# Patient Record
Sex: Female | Born: 1950
Health system: Southern US, Community
[De-identification: ages and names within clinical notes are randomized; demographics above are authoritative.]

## PROBLEM LIST (undated history)

## (undated) DIAGNOSIS — E7800 Pure hypercholesterolemia, unspecified: Secondary | ICD-10-CM

## (undated) DIAGNOSIS — I10 Essential (primary) hypertension: Secondary | ICD-10-CM

## (undated) DIAGNOSIS — I341 Nonrheumatic mitral (valve) prolapse: Secondary | ICD-10-CM

## (undated) DIAGNOSIS — K5792 Diverticulitis of intestine, part unspecified, without perforation or abscess without bleeding: Secondary | ICD-10-CM

## (undated) DIAGNOSIS — N951 Menopausal and female climacteric states: Secondary | ICD-10-CM

## (undated) DIAGNOSIS — E119 Type 2 diabetes mellitus without complications: Secondary | ICD-10-CM

## (undated) DIAGNOSIS — C50919 Malignant neoplasm of unspecified site of unspecified female breast: Secondary | ICD-10-CM

## (undated) DIAGNOSIS — D219 Benign neoplasm of connective and other soft tissue, unspecified: Secondary | ICD-10-CM

## (undated) HISTORY — DX: Menopausal and female climacteric states: N95.1

## (undated) HISTORY — DX: Pure hypercholesterolemia, unspecified: E78.00

## (undated) HISTORY — DX: Benign neoplasm of connective and other soft tissue, unspecified: D21.9

## (undated) HISTORY — DX: Malignant neoplasm of unspecified site of unspecified female breast: C50.919

## (undated) HISTORY — DX: Nonrheumatic mitral (valve) prolapse: I34.1

## (undated) HISTORY — DX: Diverticulitis of intestine, part unspecified, without perforation or abscess without bleeding: K57.92

## (undated) HISTORY — PX: FINGER AMPUTATION: SHX636

## (undated) HISTORY — PX: OTHER SURGICAL HISTORY: SHX169

---

## 1979-06-22 DIAGNOSIS — I341 Nonrheumatic mitral (valve) prolapse: Secondary | ICD-10-CM

## 1979-06-22 HISTORY — DX: Nonrheumatic mitral (valve) prolapse: I34.1

## 1999-05-16 ENCOUNTER — Other Ambulatory Visit: Admission: RE | Admit: 1999-05-16 | Discharge: 1999-05-16 | Payer: Self-pay | Admitting: *Deleted

## 2000-05-13 ENCOUNTER — Encounter: Admission: RE | Admit: 2000-05-13 | Discharge: 2000-05-13 | Payer: Self-pay | Admitting: *Deleted

## 2000-05-13 ENCOUNTER — Encounter: Payer: Self-pay | Admitting: *Deleted

## 2000-10-01 ENCOUNTER — Other Ambulatory Visit: Admission: RE | Admit: 2000-10-01 | Discharge: 2000-10-01 | Payer: Self-pay | Admitting: *Deleted

## 2001-06-10 ENCOUNTER — Encounter: Admission: RE | Admit: 2001-06-10 | Discharge: 2001-06-10 | Payer: Self-pay | Admitting: *Deleted

## 2001-06-10 ENCOUNTER — Encounter: Payer: Self-pay | Admitting: *Deleted

## 2001-12-02 ENCOUNTER — Other Ambulatory Visit: Admission: RE | Admit: 2001-12-02 | Discharge: 2001-12-02 | Payer: Self-pay | Admitting: *Deleted

## 2001-12-15 ENCOUNTER — Encounter: Admission: RE | Admit: 2001-12-15 | Discharge: 2001-12-15 | Payer: Self-pay | Admitting: *Deleted

## 2001-12-15 ENCOUNTER — Encounter: Payer: Self-pay | Admitting: *Deleted

## 2002-12-31 ENCOUNTER — Other Ambulatory Visit: Admission: RE | Admit: 2002-12-31 | Discharge: 2002-12-31 | Payer: Self-pay | Admitting: *Deleted

## 2003-10-13 ENCOUNTER — Encounter: Admission: RE | Admit: 2003-10-13 | Discharge: 2003-10-13 | Payer: Self-pay | Admitting: *Deleted

## 2003-10-22 HISTORY — PX: BREAST SURGERY: SHX581

## 2003-11-16 ENCOUNTER — Encounter: Admission: RE | Admit: 2003-11-16 | Discharge: 2003-11-16 | Payer: Self-pay | Admitting: *Deleted

## 2003-11-16 ENCOUNTER — Encounter (INDEPENDENT_AMBULATORY_CARE_PROVIDER_SITE_OTHER): Payer: Self-pay | Admitting: *Deleted

## 2003-11-22 ENCOUNTER — Encounter: Admission: RE | Admit: 2003-11-22 | Discharge: 2003-11-22 | Payer: Self-pay | Admitting: Surgery

## 2003-12-07 ENCOUNTER — Encounter (INDEPENDENT_AMBULATORY_CARE_PROVIDER_SITE_OTHER): Payer: Self-pay | Admitting: Specialist

## 2003-12-08 ENCOUNTER — Inpatient Hospital Stay (HOSPITAL_COMMUNITY): Admission: RE | Admit: 2003-12-08 | Discharge: 2003-12-09 | Payer: Self-pay | Admitting: Surgery

## 2004-01-03 ENCOUNTER — Ambulatory Visit: Admission: RE | Admit: 2004-01-03 | Discharge: 2004-01-03 | Payer: Self-pay | Admitting: Oncology

## 2004-01-03 ENCOUNTER — Encounter (INDEPENDENT_AMBULATORY_CARE_PROVIDER_SITE_OTHER): Payer: Self-pay | Admitting: Cardiology

## 2004-01-05 ENCOUNTER — Ambulatory Visit (HOSPITAL_COMMUNITY): Admission: RE | Admit: 2004-01-05 | Discharge: 2004-01-05 | Payer: Self-pay | Admitting: Oncology

## 2004-04-17 ENCOUNTER — Encounter (INDEPENDENT_AMBULATORY_CARE_PROVIDER_SITE_OTHER): Payer: Self-pay | Admitting: Cardiology

## 2004-04-17 ENCOUNTER — Ambulatory Visit: Admission: RE | Admit: 2004-04-17 | Discharge: 2004-04-17 | Payer: Self-pay | Admitting: Oncology

## 2004-07-05 ENCOUNTER — Encounter (INDEPENDENT_AMBULATORY_CARE_PROVIDER_SITE_OTHER): Payer: Self-pay | Admitting: *Deleted

## 2004-07-05 ENCOUNTER — Ambulatory Visit (HOSPITAL_COMMUNITY): Admission: RE | Admit: 2004-07-05 | Discharge: 2004-07-05 | Payer: Self-pay | Admitting: Surgery

## 2004-08-29 ENCOUNTER — Ambulatory Visit: Payer: Self-pay | Admitting: Oncology

## 2004-09-21 ENCOUNTER — Ambulatory Visit: Payer: Self-pay

## 2004-10-21 DIAGNOSIS — C50919 Malignant neoplasm of unspecified site of unspecified female breast: Secondary | ICD-10-CM

## 2004-10-21 HISTORY — DX: Malignant neoplasm of unspecified site of unspecified female breast: C50.919

## 2004-10-31 ENCOUNTER — Ambulatory Visit: Payer: Self-pay | Admitting: Oncology

## 2005-01-01 ENCOUNTER — Ambulatory Visit: Payer: Self-pay | Admitting: Oncology

## 2005-01-01 ENCOUNTER — Ambulatory Visit: Payer: Self-pay

## 2005-01-02 ENCOUNTER — Ambulatory Visit (HOSPITAL_COMMUNITY): Admission: RE | Admit: 2005-01-02 | Discharge: 2005-01-02 | Payer: Self-pay | Admitting: Oncology

## 2005-03-05 ENCOUNTER — Ambulatory Visit: Payer: Self-pay | Admitting: Oncology

## 2005-05-07 ENCOUNTER — Ambulatory Visit: Payer: Self-pay | Admitting: Oncology

## 2005-07-04 ENCOUNTER — Encounter (INDEPENDENT_AMBULATORY_CARE_PROVIDER_SITE_OTHER): Payer: Self-pay | Admitting: *Deleted

## 2005-07-04 ENCOUNTER — Ambulatory Visit: Admission: RE | Admit: 2005-07-04 | Discharge: 2005-07-04 | Payer: Self-pay | Admitting: Oncology

## 2005-07-09 ENCOUNTER — Ambulatory Visit: Payer: Self-pay | Admitting: Oncology

## 2005-07-19 ENCOUNTER — Other Ambulatory Visit: Admission: RE | Admit: 2005-07-19 | Discharge: 2005-07-19 | Payer: Self-pay | Admitting: *Deleted

## 2005-10-08 ENCOUNTER — Ambulatory Visit: Payer: Self-pay | Admitting: Oncology

## 2005-10-16 IMAGING — CR DG CHEST 2V
2 series · 2 of 2 positions shown · non-contrast
Comparison: none

CLINICAL DATA: Breast carcinoma.
 CHEST, TWO VIEW
 There is no previous available for comparison.
 The heart size and mediastinal contours are unremarkable.  The lungs are clear.  The visualized skeleton is unremarkable.
 IMPRESSION
 No active disease.

[view not recorded (1 of 2)]
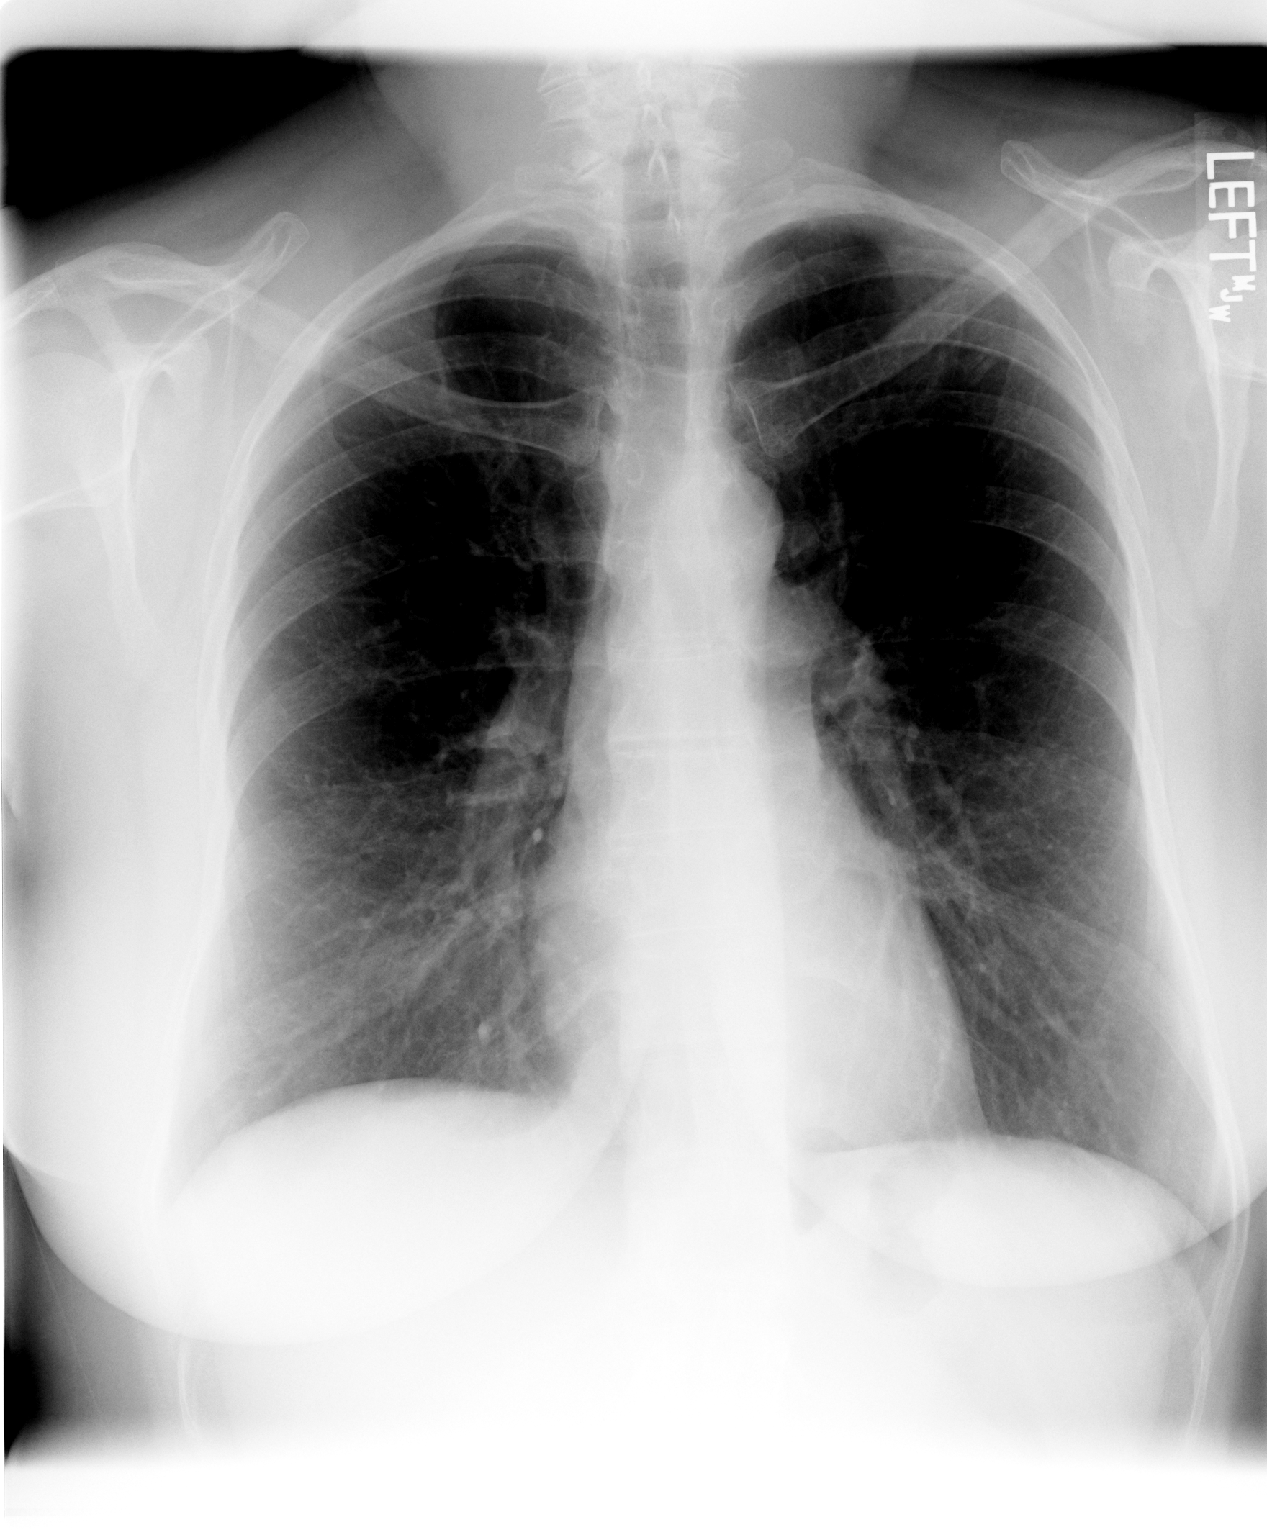

[view not recorded (2 of 2)]
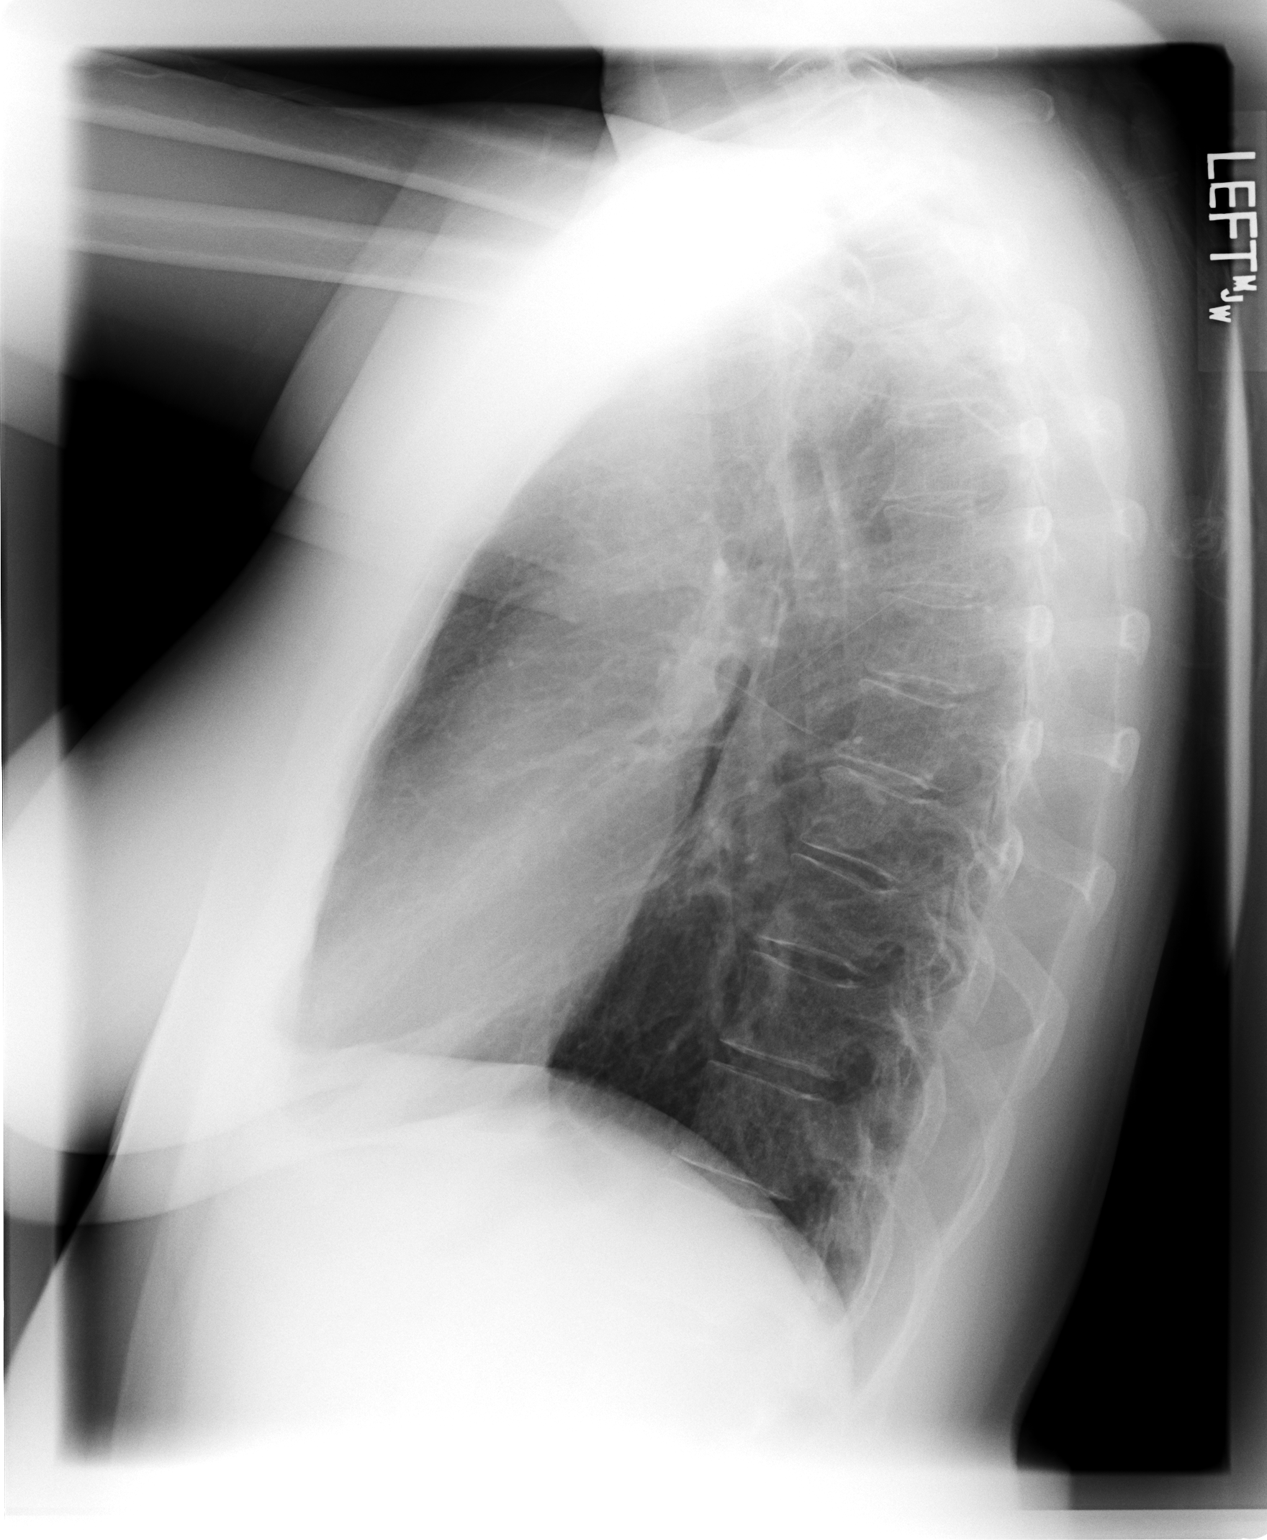

[2 of 2 positions shown; findings below may reference images not displayed]

## 2006-01-23 ENCOUNTER — Ambulatory Visit: Payer: Self-pay | Admitting: Oncology

## 2006-01-23 LAB — COMPREHENSIVE METABOLIC PANEL
ALT: 20 U/L (ref 0–40)
AST: 24 U/L (ref 0–37)
Albumin: 4.6 g/dL (ref 3.5–5.2)
CO2: 28 mEq/L (ref 19–32)
Calcium: 9.9 mg/dL (ref 8.4–10.5)
Chloride: 103 mEq/L (ref 96–112)
Creatinine, Ser: 0.7 mg/dL (ref 0.4–1.2)
Potassium: 3.8 mEq/L (ref 3.5–5.3)
Total Protein: 6.9 g/dL (ref 6.0–8.3)

## 2006-01-23 LAB — CBC WITH DIFFERENTIAL/PLATELET
BASO%: 0.4 % (ref 0.0–2.0)
HCT: 38 % (ref 34.8–46.6)
HGB: 13.1 g/dL (ref 11.6–15.9)
MCHC: 34.4 g/dL (ref 32.0–36.0)
MONO#: 0.6 10*3/uL (ref 0.1–0.9)
NEUT%: 58.4 % (ref 39.6–76.8)
RDW: 13.1 % (ref 11.3–14.5)
WBC: 7.2 10*3/uL (ref 3.9–10.0)
lymph#: 2.3 10*3/uL (ref 0.9–3.3)

## 2006-01-23 LAB — CANCER ANTIGEN 27.29: CA 27.29: 24 U/mL (ref 0–39)

## 2006-07-30 ENCOUNTER — Ambulatory Visit: Payer: Self-pay | Admitting: Oncology

## 2006-08-11 ENCOUNTER — Encounter: Admission: RE | Admit: 2006-08-11 | Discharge: 2006-08-11 | Payer: Self-pay | Admitting: Oncology

## 2006-10-21 HISTORY — PX: COLONOSCOPY: SHX174

## 2006-12-25 ENCOUNTER — Other Ambulatory Visit: Admission: RE | Admit: 2006-12-25 | Discharge: 2006-12-25 | Payer: Self-pay | Admitting: Obstetrics and Gynecology

## 2007-01-22 ENCOUNTER — Ambulatory Visit: Payer: Self-pay | Admitting: Oncology

## 2007-01-27 ENCOUNTER — Ambulatory Visit (HOSPITAL_COMMUNITY): Admission: RE | Admit: 2007-01-27 | Discharge: 2007-01-27 | Payer: Self-pay | Admitting: Oncology

## 2007-01-27 LAB — CBC WITH DIFFERENTIAL/PLATELET
BASO%: 0.4 % (ref 0.0–2.0)
EOS%: 1.1 % (ref 0.0–7.0)
HGB: 13.1 g/dL (ref 11.6–15.9)
MCH: 31.7 pg (ref 26.0–34.0)
MCHC: 35.8 g/dL (ref 32.0–36.0)
MCV: 88.6 fL (ref 81.0–101.0)
MONO%: 7.9 % (ref 0.0–13.0)
RBC: 4.15 10*6/uL (ref 3.70–5.32)
RDW: 13.1 % (ref 11.3–14.5)
lymph#: 2.8 10*3/uL (ref 0.9–3.3)

## 2007-01-27 LAB — COMPREHENSIVE METABOLIC PANEL
ALT: 23 U/L (ref 0–35)
AST: 24 U/L (ref 0–37)
CO2: 26 mEq/L (ref 19–32)
Calcium: 9.6 mg/dL (ref 8.4–10.5)
Chloride: 103 mEq/L (ref 96–112)
Potassium: 3.8 mEq/L (ref 3.5–5.3)
Sodium: 141 mEq/L (ref 135–145)
Total Protein: 7 g/dL (ref 6.0–8.3)

## 2007-01-27 LAB — CANCER ANTIGEN 27.29: CA 27.29: 21 U/mL (ref 0–39)

## 2007-02-27 LAB — HM COLONOSCOPY: HM Colonoscopy: NORMAL

## 2007-04-28 ENCOUNTER — Ambulatory Visit: Payer: Self-pay | Admitting: Family Medicine

## 2007-07-10 ENCOUNTER — Ambulatory Visit: Payer: Self-pay | Admitting: Oncology

## 2007-07-14 LAB — CBC WITH DIFFERENTIAL/PLATELET
BASO%: 0.2 % (ref 0.0–2.0)
Basophils Absolute: 0 10*3/uL (ref 0.0–0.1)
EOS%: 1.5 % (ref 0.0–7.0)
MCH: 31.5 pg (ref 26.0–34.0)
MCHC: 35.5 g/dL (ref 32.0–36.0)
MCV: 88.6 fL (ref 81.0–101.0)
MONO%: 6.2 % (ref 0.0–13.0)
NEUT%: 54.7 % (ref 39.6–76.8)
RDW: 12.8 % (ref 11.3–14.5)
lymph#: 2.6 10*3/uL (ref 0.9–3.3)

## 2007-07-14 LAB — COMPREHENSIVE METABOLIC PANEL
ALT: 19 U/L (ref 0–35)
AST: 20 U/L (ref 0–37)
Alkaline Phosphatase: 56 U/L (ref 39–117)
BUN: 24 mg/dL — ABNORMAL HIGH (ref 6–23)
Calcium: 9.3 mg/dL (ref 8.4–10.5)
Chloride: 103 mEq/L (ref 96–112)
Creatinine, Ser: 0.76 mg/dL (ref 0.40–1.20)
Potassium: 3.9 mEq/L (ref 3.5–5.3)

## 2007-10-29 ENCOUNTER — Ambulatory Visit: Payer: Self-pay | Admitting: Oncology

## 2007-10-29 LAB — COMPREHENSIVE METABOLIC PANEL
AST: 20 U/L (ref 0–37)
Albumin: 4.8 g/dL (ref 3.5–5.2)
Alkaline Phosphatase: 55 U/L (ref 39–117)
BUN: 16 mg/dL (ref 6–23)
Calcium: 9.6 mg/dL (ref 8.4–10.5)
Chloride: 105 mEq/L (ref 96–112)
Potassium: 4 mEq/L (ref 3.5–5.3)
Sodium: 143 mEq/L (ref 135–145)
Total Protein: 7.5 g/dL (ref 6.0–8.3)

## 2007-10-29 LAB — CBC WITH DIFFERENTIAL/PLATELET
BASO%: 1.2 % (ref 0.0–2.0)
Basophils Absolute: 0.1 10*3/uL (ref 0.0–0.1)
EOS%: 1.7 % (ref 0.0–7.0)
HCT: 38.7 % (ref 34.8–46.6)
HGB: 13.4 g/dL (ref 11.6–15.9)
LYMPH%: 38.8 % (ref 14.0–48.0)
MCH: 30.6 pg (ref 26.0–34.0)
MCHC: 34.6 g/dL (ref 32.0–36.0)
MCV: 88.5 fL (ref 81.0–101.0)
MONO%: 8.5 % (ref 0.0–13.0)
NEUT%: 49.8 % (ref 39.6–76.8)

## 2007-12-25 ENCOUNTER — Ambulatory Visit: Payer: Self-pay | Admitting: Family Medicine

## 2008-01-27 ENCOUNTER — Ambulatory Visit: Payer: Self-pay | Admitting: Oncology

## 2008-02-01 LAB — CBC WITH DIFFERENTIAL/PLATELET
Basophils Absolute: 0 10*3/uL (ref 0.0–0.1)
EOS%: 1.4 % (ref 0.0–7.0)
HGB: 13.1 g/dL (ref 11.6–15.9)
MCH: 31.3 pg (ref 26.0–34.0)
MCV: 87.7 fL (ref 81.0–101.0)
MONO%: 8.2 % (ref 0.0–13.0)
RDW: 12.9 % (ref 11.3–14.5)

## 2008-02-02 LAB — COMPREHENSIVE METABOLIC PANEL
AST: 20 U/L (ref 0–37)
Albumin: 4.5 g/dL (ref 3.5–5.2)
Alkaline Phosphatase: 55 U/L (ref 39–117)
BUN: 20 mg/dL (ref 6–23)
Creatinine, Ser: 0.77 mg/dL (ref 0.40–1.20)
Potassium: 3.7 mEq/L (ref 3.5–5.3)
Total Bilirubin: 0.3 mg/dL (ref 0.3–1.2)

## 2008-02-02 LAB — CANCER ANTIGEN 27.29: CA 27.29: 20 U/mL (ref 0–39)

## 2008-02-10 LAB — ESTRADIOL, ULTRA SENS: Estradiol, Ultra Sensitive: <2 pg/mL

## 2008-02-19 ENCOUNTER — Other Ambulatory Visit: Admission: RE | Admit: 2008-02-19 | Discharge: 2008-02-19 | Payer: Self-pay | Admitting: Obstetrics and Gynecology

## 2008-03-02 ENCOUNTER — Ambulatory Visit (HOSPITAL_COMMUNITY): Admission: RE | Admit: 2008-03-02 | Discharge: 2008-03-02 | Payer: Self-pay | Admitting: Oncology

## 2008-05-31 ENCOUNTER — Ambulatory Visit: Payer: Self-pay | Admitting: Family Medicine

## 2008-08-09 ENCOUNTER — Ambulatory Visit: Payer: Self-pay | Admitting: Oncology

## 2008-08-11 ENCOUNTER — Encounter: Admission: RE | Admit: 2008-08-11 | Discharge: 2008-08-11 | Payer: Self-pay | Admitting: Oncology

## 2008-08-11 LAB — CBC WITH DIFFERENTIAL/PLATELET
BASO%: 0.4 % (ref 0.0–2.0)
LYMPH%: 30.7 % (ref 14.0–48.0)
MCHC: 34.5 g/dL (ref 32.0–36.0)
MONO#: 0.4 10*3/uL (ref 0.1–0.9)
MONO%: 5.5 % (ref 0.0–13.0)
Platelets: 240 10*3/uL (ref 145–400)
RBC: 4.33 10*6/uL (ref 3.70–5.32)
WBC: 7.7 10*3/uL (ref 3.9–10.0)

## 2008-08-12 LAB — COMPREHENSIVE METABOLIC PANEL
ALT: 31 U/L (ref 0–35)
CO2: 24 mEq/L (ref 19–32)
Calcium: 8.9 mg/dL (ref 8.4–10.5)
Chloride: 103 mEq/L (ref 96–112)
Potassium: 3.7 mEq/L (ref 3.5–5.3)
Sodium: 138 mEq/L (ref 135–145)
Total Bilirubin: 0.4 mg/dL (ref 0.3–1.2)
Total Protein: 7.2 g/dL (ref 6.0–8.3)

## 2008-08-12 LAB — CANCER ANTIGEN 27.29: CA 27.29: 21 U/mL (ref 0–39)

## 2008-12-11 IMAGING — CR DG CHEST 2V
2 series · 2 of 2 positions shown · non-contrast
Comparison: 01/02/05.

CLINICAL DATA: 55 year-old-female with bilateral mastectomies.  History of breast cancer. 
 CHEST - 2 VIEW:

[view not recorded (1 of 2)]
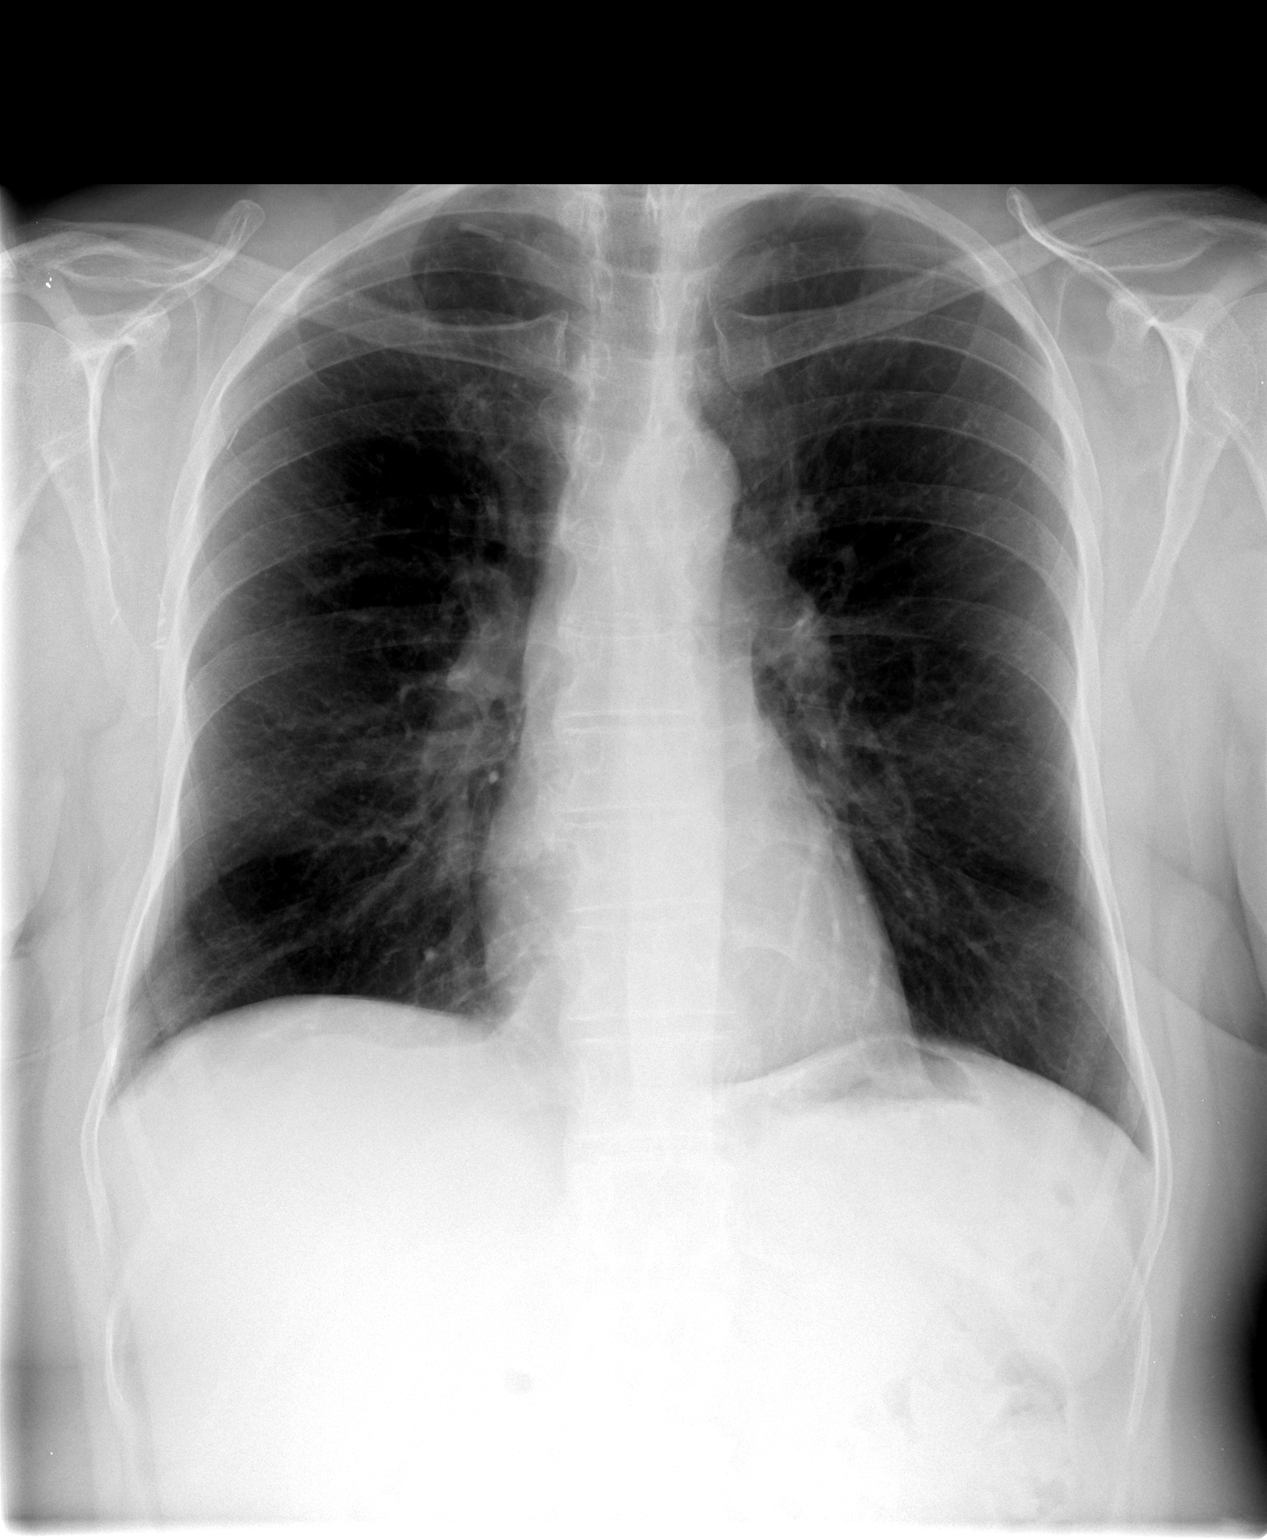

[view not recorded (2 of 2)]
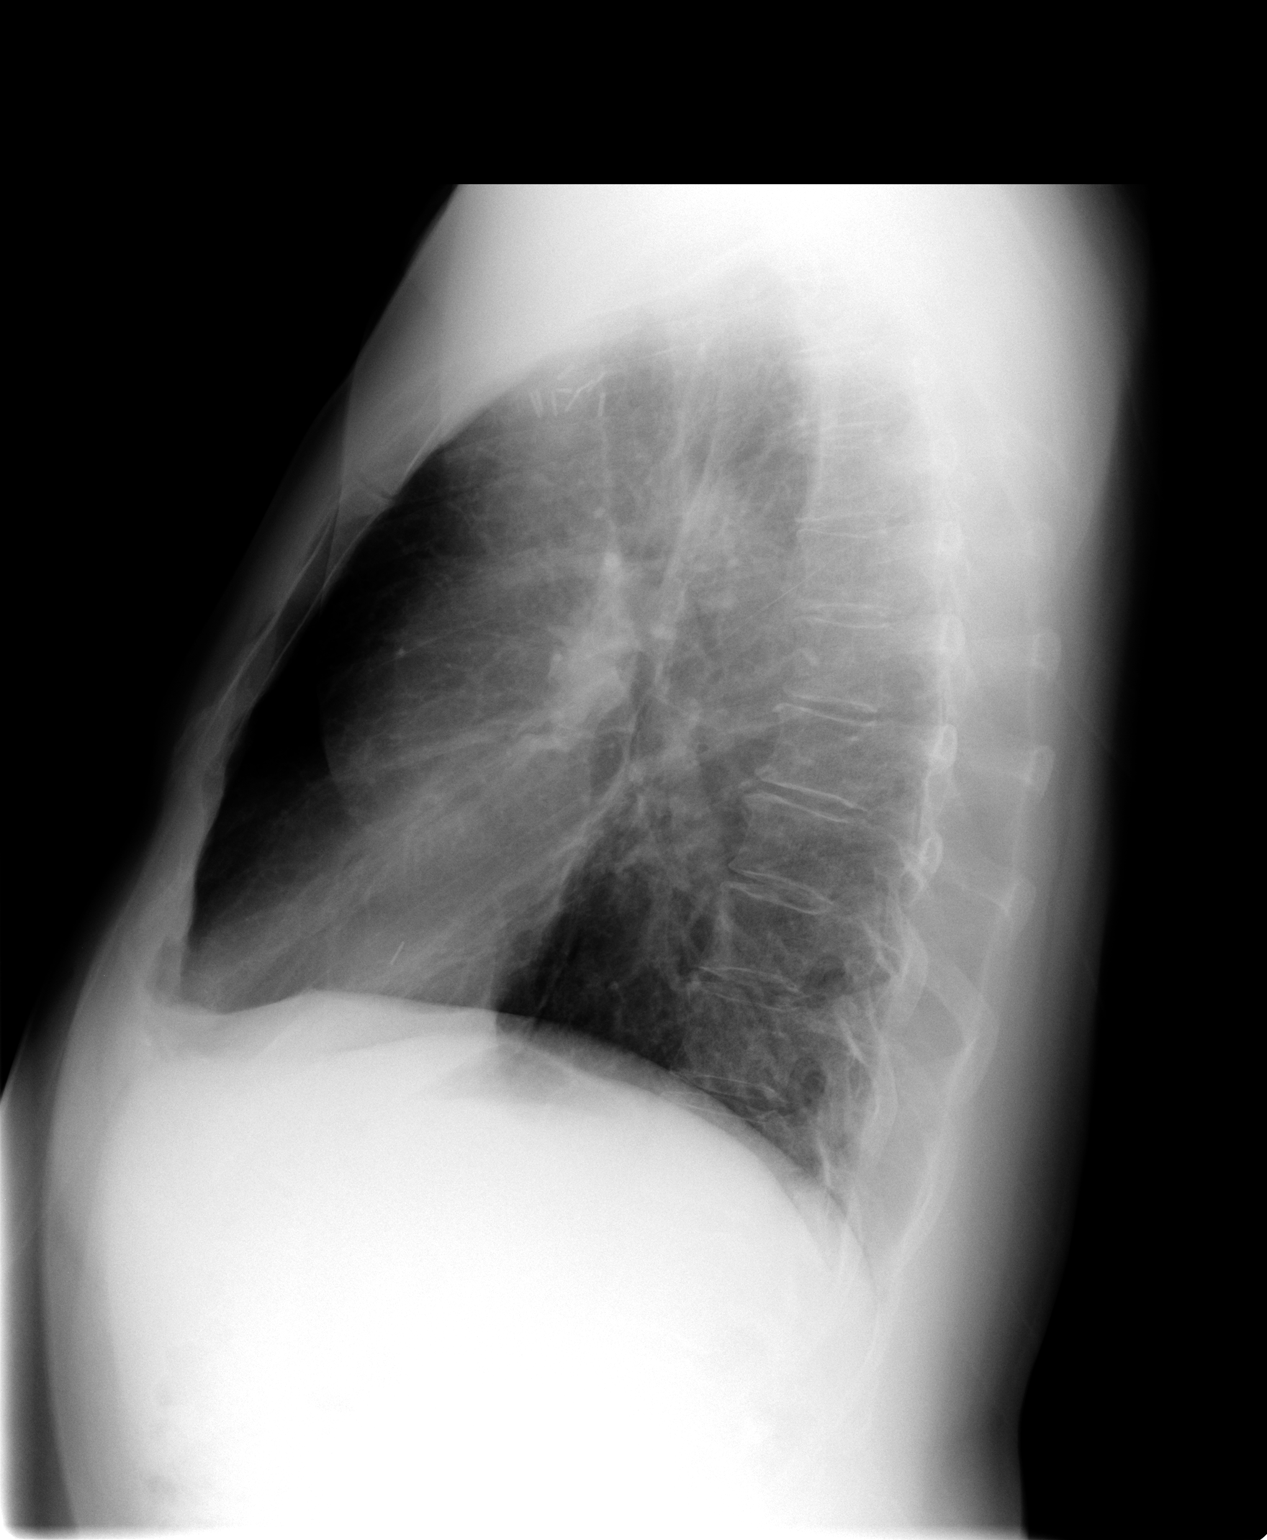

[2 of 2 positions shown; findings below may reference images not displayed]

FINDINGS: The cardiac silhouette, mediastinal and hilar contours are within normal limits and stable.  The lungs are clear.  Stable surgical changes in the right axilla.
IMPRESSION: No acute cardiopulmonary findings.   Stable appearance of chest since 01/02/05.

## 2009-01-16 ENCOUNTER — Ambulatory Visit: Payer: Self-pay | Admitting: Family Medicine

## 2009-02-07 ENCOUNTER — Ambulatory Visit: Payer: Self-pay | Admitting: Oncology

## 2009-02-09 ENCOUNTER — Ambulatory Visit (HOSPITAL_COMMUNITY): Admission: RE | Admit: 2009-02-09 | Discharge: 2009-02-09 | Payer: Self-pay | Admitting: Oncology

## 2009-02-09 LAB — COMPREHENSIVE METABOLIC PANEL
Albumin: 4.3 g/dL (ref 3.5–5.2)
Alkaline Phosphatase: 56 U/L (ref 39–117)
CO2: 27 mEq/L (ref 19–32)
Chloride: 101 mEq/L (ref 96–112)
Glucose, Bld: 97 mg/dL (ref 70–99)
Potassium: 4 mEq/L (ref 3.5–5.3)
Sodium: 139 mEq/L (ref 135–145)
Total Protein: 6.9 g/dL (ref 6.0–8.3)

## 2009-02-09 LAB — CBC WITH DIFFERENTIAL/PLATELET
Eosinophils Absolute: 0.2 10*3/uL (ref 0.0–0.5)
MONO#: 0.5 10*3/uL (ref 0.1–0.9)
MONO%: 6.6 % (ref 0.0–14.0)
NEUT#: 4.5 10*3/uL (ref 1.5–6.5)
RBC: 4.27 10*6/uL (ref 3.70–5.45)
RDW: 13.1 % (ref 11.2–14.5)
WBC: 7.8 10*3/uL (ref 3.9–10.3)
lymph#: 2.6 10*3/uL (ref 0.9–3.3)

## 2009-06-30 ENCOUNTER — Ambulatory Visit: Payer: Self-pay | Admitting: Family Medicine

## 2009-07-21 ENCOUNTER — Ambulatory Visit: Payer: Self-pay | Admitting: Oncology

## 2009-07-25 LAB — CBC WITH DIFFERENTIAL/PLATELET
Basophils Absolute: 0 10*3/uL (ref 0.0–0.1)
Eosinophils Absolute: 0.1 10*3/uL (ref 0.0–0.5)
HGB: 13.3 g/dL (ref 11.6–15.9)
MONO#: 0.5 10*3/uL (ref 0.1–0.9)
NEUT#: 3.7 10*3/uL (ref 1.5–6.5)
Platelets: 249 10*3/uL (ref 145–400)
RBC: 4.23 10*6/uL (ref 3.70–5.45)
RDW: 13.4 % (ref 11.2–14.5)
WBC: 7.1 10*3/uL (ref 3.9–10.3)

## 2009-07-25 LAB — COMPREHENSIVE METABOLIC PANEL
Albumin: 4.6 g/dL (ref 3.5–5.2)
BUN: 16 mg/dL (ref 6–23)
CO2: 25 mEq/L (ref 19–32)
Calcium: 9.1 mg/dL (ref 8.4–10.5)
Glucose, Bld: 99 mg/dL (ref 70–99)
Potassium: 4 mEq/L (ref 3.5–5.3)
Sodium: 142 mEq/L (ref 135–145)
Total Protein: 7.2 g/dL (ref 6.0–8.3)

## 2009-08-18 ENCOUNTER — Ambulatory Visit (HOSPITAL_COMMUNITY): Admission: RE | Admit: 2009-08-18 | Discharge: 2009-08-18 | Payer: Self-pay | Admitting: Oncology

## 2009-08-25 ENCOUNTER — Ambulatory Visit (HOSPITAL_COMMUNITY): Admission: RE | Admit: 2009-08-25 | Discharge: 2009-08-25 | Payer: Self-pay | Admitting: Oncology

## 2009-12-11 ENCOUNTER — Ambulatory Visit: Payer: Self-pay | Admitting: Oncology

## 2009-12-13 LAB — CBC WITH DIFFERENTIAL/PLATELET
BASO%: 0.6 % (ref 0.0–2.0)
EOS%: 1.5 % (ref 0.0–7.0)
HCT: 38.7 % (ref 34.8–46.6)
LYMPH%: 43.3 % (ref 14.0–49.7)
MCH: 31.7 pg (ref 25.1–34.0)
MCHC: 34.9 g/dL (ref 31.5–36.0)
MONO#: 0.6 10*3/uL (ref 0.1–0.9)
MONO%: 10 % (ref 0.0–14.0)
NEUT%: 44.6 % (ref 38.4–76.8)
Platelets: 222 10*3/uL (ref 145–400)
RBC: 4.25 10*6/uL (ref 3.70–5.45)
WBC: 5.6 10*3/uL (ref 3.9–10.3)

## 2010-03-09 ENCOUNTER — Ambulatory Visit: Payer: Self-pay | Admitting: Family Medicine

## 2010-05-04 ENCOUNTER — Ambulatory Visit: Payer: Self-pay | Admitting: Family Medicine

## 2010-07-16 ENCOUNTER — Ambulatory Visit: Payer: Self-pay | Admitting: Family Medicine

## 2010-08-07 ENCOUNTER — Ambulatory Visit: Payer: Self-pay | Admitting: Oncology

## 2010-08-09 ENCOUNTER — Ambulatory Visit (HOSPITAL_COMMUNITY): Admission: RE | Admit: 2010-08-09 | Discharge: 2010-08-09 | Payer: Self-pay | Admitting: Oncology

## 2010-08-09 LAB — COMPREHENSIVE METABOLIC PANEL
ALT: 20 U/L (ref 0–35)
Albumin: 4.2 g/dL (ref 3.5–5.2)
Alkaline Phosphatase: 31 U/L — ABNORMAL LOW (ref 39–117)
CO2: 26 mEq/L (ref 19–32)
Glucose, Bld: 113 mg/dL — ABNORMAL HIGH (ref 70–99)
Potassium: 3.8 mEq/L (ref 3.5–5.3)
Sodium: 143 mEq/L (ref 135–145)
Total Bilirubin: 0.3 mg/dL (ref 0.3–1.2)
Total Protein: 6.4 g/dL (ref 6.0–8.3)

## 2010-08-09 LAB — CBC WITH DIFFERENTIAL/PLATELET
BASO%: 0.5 % (ref 0.0–2.0)
Eosinophils Absolute: 0.1 10*3/uL (ref 0.0–0.5)
LYMPH%: 42.5 % (ref 14.0–49.7)
MCHC: 34 g/dL (ref 31.5–36.0)
MONO#: 0.6 10*3/uL (ref 0.1–0.9)
MONO%: 9.3 % (ref 0.0–14.0)
NEUT#: 2.8 10*3/uL (ref 1.5–6.5)
RBC: 3.9 10*6/uL (ref 3.70–5.45)
RDW: 13.3 % (ref 11.2–14.5)
WBC: 6 10*3/uL (ref 3.9–10.3)

## 2010-08-13 ENCOUNTER — Encounter: Admission: RE | Admit: 2010-08-13 | Discharge: 2010-08-13 | Payer: Self-pay | Admitting: Oncology

## 2010-08-13 LAB — HM DEXA SCAN

## 2010-11-10 ENCOUNTER — Encounter: Payer: Self-pay | Admitting: Surgery

## 2011-03-08 NOTE — Op Note (Signed)
[name], [name]                       ACCOUNT NO.:  [id number]   MEDICAL RECORD NO.:  [id number]                   PATIENT TYPE:  AMB   LOCATION:  DAY                                  FACILITY:  [facility]   PHYSICIAN:  Currie Paris, M.D.           DATE OF BIRTH:  [birth year]   DATE OF PROCEDURE:  07/05/2004  DATE OF DISCHARGE:                                 OPERATIVE REPORT   PREOPERATIVE DIAGNOSES:  Bilateral mastectomy, chronic seroma cavities.   POSTOPERATIVE DIAGNOSES:  Bilateral mastectomy, chronic seroma cavities.   OPERATION:  Drainage of bilateral seroma cavities with scarification of  capsule, partial excision of capsule and placement of Tisseel.   SURGEON:  Currie Paris, M.D.   ANESTHESIA:  General.   CLINICAL COURSE:  This patient has been undergoing chemotherapy and we have  never been able to dry up her bilateral seroma cavities. The right has been  much larger than the left. Now that she has basically finished her  chemotherapy, she was felt able to undergo surgical drainage.   DESCRIPTION OF PROCEDURE:  The patient was seen in the holding area and had  no further questions. She was taken to the operating room and after  satisfactory general anesthesia had been obtained, the breast areas were  prepped and draped. I opened the old mastectomy scar on the right side and  entered a large seroma cavity which extended pretty much the length of the  incision somewhat below it and then all the way up into the axilla.  Using  the cautery, I was able to excise portions of this cavity especially deep  along the muscle but there was very little way to excise the more  superficial or skin side of the capsule.  I tried to use the cautery on  coagulation current as much as possible to scarify or scare up the entire  capsule.  Once everything appeared to be dry, I put a 10 Blake drain in and  turned my attention to the other side.  This was done in the same  fashion  but had a smaller cavity but again extended somewhat towards the axilla.  Again we excised portions of it, scarified the rest.  I then used Tisseel  and put Tisseel along and into both cavities trying to apply it to the  capsule in all directions.  I then did that on the left side first, closed  it with staples and applied pressure and while that was being held did the  right side in a similar fashion and closed the wound with staples. The  drains were secured with 2-0 nylons. After approximately 10 minutes of  pressure, we applied sterile dressings. There had been no significant  bleeding during the case. All counts were correct.  Currie Paris, M.D.    CJS/MEDQ  D:  07/05/2004  T:  07/05/2004  Job:  [id number]

## 2011-03-08 NOTE — Op Note (Signed)
NAMESHEARON, [name]                       ACCOUNT NO.:  [id number]   MEDICAL RECORD NO.:  [id number]                   PATIENT TYPE:  OIB   LOCATION:  2899                                 FACILITY:  [facility]   PHYSICIAN:  Currie Paris, M.D.           DATE OF BIRTH:  Feb 09, 1951   DATE OF PROCEDURE:  12/07/2003  DATE OF DISCHARGE:                                 OPERATIVE REPORT   PREOPERATIVE DIAGNOSIS:  Carcinoma right breast, upper outer quadrant.   POSTOPERATIVE DIAGNOSIS:  Right breast carcinoma upper outer quadrant with  axillary metastases.   OPERATION:  1. Right total mastectomy with blue dye injection and sentinel node     dissection and axillary dissection.  2. Left prophylactic mastectomy.   SURGEON:  Currie Paris, M.D.   ASSISTANT:  Anselm Pancoast. Zachery Dakins, M.D.   ANESTHESIA:  General endotracheal anesthesia.   CLINICAL HISTORY:  This patient is a 60 year old lady who has recently  presented with a right breast mass which was found to be invasive carcinoma.  After a lengthy discussion with the patient, she expressed a strong desire  to have not only a right mastectomy but a prophylactic left mastectomy.  This was discussed at length and the plans were made for right mastectomy  with sentinel node biopsy and possible dissection as well as left  prophylactic mastectomy.  She desired no reconstruction.   DESCRIPTION OF PROCEDURE:  The patient was seen in the holding area and had  no further questions.  The right side had already been injected with sulfur  colloid and I marked it as the operative side as the patient had already  initialed that as the side of the lymph node.  We again discussed the need  for a prophylactic left mastectomy and she, again, asked that we proceed  with that.  The patient was taken to the operating room and after  satisfactory general anesthesia had been obtained, dilute methylene blue was  injected subareolarly on the right  side.  Both breasts were prepped and  draped as a single sterile field.   The right side was approached first and using the Neoprobe, I identified a  hot area in the axilla and marked the overlying skin and then outlined an  elliptical incision.  The incision was made along the superior aspect and a  superior flap raised going medially to the edge of the sternum, superiorly  towards the clavicle, and laterally to the axilla, and when I lifted up the  skin over the area marked I went a little bit deeper until I entered the  maxilla proper.  In doing so, I found a blue lymphatic and traced it to a  very bright blue node which had counts of about 3300.  Some were large,  about 1.5 cm, and I took this out.  Using the Neoprobe, I saw another hot  area just posterior to this and a little more  dissection showed another node  that was firm and was excised and had just developed some blue dye in it, as  well.  With this out, we sent this off, and there were no other hot areas,  no other blue areas, and no other readily palpable adenopathy.   Attention was returned to the breast and the inferior incision made and the  inferior flap raised going to the rectus sheath and then laterally to the  latissimus.  The breast was then removed starting medially and working  laterally and using cautery, taking it off along with the pectoralis fascia.  I opened the clavipectoral fascia and, at this point, Dr. Debby Bud from  pathology called to report that one of the two nodes was positive for  metastatic disease.  We then proceeded to full nodal dissection.  I opened  the clavipectoral fascia and began sweeping the axillary contents from  anterior to posterior and superior to inferior.  The second intercostal  nerve was identified and clipped.  The nerve and blood supply to the  pectoralis was preserved.  A couple of firm nodes were noted in the area of  the thoracodorsal vessels.  I did find the axillary vein and  stripped the  contents down from that taking care not to injure that.  The long thoracic  thoracodorsal nerves were both identified and preserved and the intervening  tissue teased out.  The vessels were clipped, tied, or cauterized as  indicated.  The final attachments to the latissimus were divided and the  specimen handed off.  I spent several minutes making sure everything was dry  and then put two 19 Blake drains in through the inferior flap, one towards  the axilla and one for the flap, and left a moist lap in place.   Attention was turned to the left side.  Gloves and instruments were changed.  Mastectomy was performed on this side identical to the right side with  raising of the skin flaps and basically no entry into the axilla, although  in the lateral aspect we found one low slightly enlarged low.  Bleeders,  again, were coagulated.  Once everything appeared to be dry along the skin  flaps, two 19 Blake drains were placed here.  A final irrigation here was  made, a final check for hemostasis, and the incision was closed with  staples.  Attention was turned back to the right side and this had remained  dry while we were working on the left and it was closed with staples, as  well.   The patient tolerated the procedure well.  There were no complications.  All  counts were correct.  Estimated blood loss was 150 mL.                                               Currie Paris, M.D.    CJS/MEDQ  D:  12/07/2003  T:  12/07/2003  Job:  [id number]

## 2011-04-03 ENCOUNTER — Other Ambulatory Visit: Payer: Self-pay | Admitting: Family Medicine

## 2011-05-02 ENCOUNTER — Other Ambulatory Visit: Payer: Self-pay | Admitting: Family Medicine

## 2011-05-30 ENCOUNTER — Other Ambulatory Visit: Payer: Self-pay | Admitting: Family Medicine

## 2011-05-31 ENCOUNTER — Other Ambulatory Visit: Payer: Self-pay | Admitting: *Deleted

## 2011-06-07 ENCOUNTER — Other Ambulatory Visit: Payer: Self-pay | Admitting: Family Medicine

## 2011-06-10 ENCOUNTER — Encounter: Payer: Self-pay | Admitting: Family Medicine

## 2011-06-11 ENCOUNTER — Encounter: Payer: Self-pay | Admitting: Family Medicine

## 2011-06-11 ENCOUNTER — Ambulatory Visit (INDEPENDENT_AMBULATORY_CARE_PROVIDER_SITE_OTHER): Payer: BC Managed Care – PPO | Admitting: Family Medicine

## 2011-06-11 VITALS — BP 114/70 | HR 86 | Ht 62.0 in | Wt 162.0 lb

## 2011-06-11 DIAGNOSIS — Z79899 Other long term (current) drug therapy: Secondary | ICD-10-CM

## 2011-06-11 DIAGNOSIS — E785 Hyperlipidemia, unspecified: Secondary | ICD-10-CM | POA: Insufficient documentation

## 2011-06-11 DIAGNOSIS — Z0000 Encounter for general adult medical examination without abnormal findings: Secondary | ICD-10-CM

## 2011-06-11 DIAGNOSIS — Z853 Personal history of malignant neoplasm of breast: Secondary | ICD-10-CM

## 2011-06-11 DIAGNOSIS — M818 Other osteoporosis without current pathological fracture: Secondary | ICD-10-CM | POA: Insufficient documentation

## 2011-06-11 LAB — COMPREHENSIVE METABOLIC PANEL
ALT: 15 U/L (ref 0–35)
Albumin: 4.3 g/dL (ref 3.5–5.2)
CO2: 24 mEq/L (ref 19–32)
Calcium: 9 mg/dL (ref 8.4–10.5)
Chloride: 106 mEq/L (ref 96–112)
Glucose, Bld: 101 mg/dL — ABNORMAL HIGH (ref 70–99)
Potassium: 4.4 mEq/L (ref 3.5–5.3)
Sodium: 140 mEq/L (ref 135–145)
Total Bilirubin: 0.4 mg/dL (ref 0.3–1.2)
Total Protein: 6.7 g/dL (ref 6.0–8.3)

## 2011-06-11 LAB — POCT URINALYSIS DIPSTICK
Bilirubin, UA: NEGATIVE
Blood, UA: NEGATIVE
Glucose, UA: NEGATIVE
Nitrite, UA: NEGATIVE
Spec Grav, UA: 1.02

## 2011-06-11 LAB — LIPID PANEL
Cholesterol: 162 mg/dL (ref 0–200)
Triglycerides: 277 mg/dL — ABNORMAL HIGH (ref <150)
VLDL: 55 mg/dL — ABNORMAL HIGH (ref 0–40)

## 2011-06-11 NOTE — Progress Notes (Signed)
Addended by: Ronnald Nian on: 06/11/2011 10:37 AM   Modules accepted: Orders

## 2011-06-11 NOTE — Patient Instructions (Signed)
Increased physical activity to a total of 150 minutes per week.

## 2011-06-11 NOTE — Progress Notes (Signed)
  Subjective:    Patient ID: [name], female    DOB: Sep 11, 1951, 60 y.o.   MRN: [id number]  HPI She is here for a complete examination. She was recently seen by her gynecologist. Apparently a vitamin D level was within the normal range. She continues on Fosamax for treatment of underlying osteoporosis secondary to cancer chemotherapy. She continues on Herceptin Crestor is having no difficulty with that. She continues on calcium, vitamin D. She does have some minor aches and pains. Her family and social history were reviewed and are in the record.   Review of Systems  Constitutional: Negative.   HENT: Negative.   Eyes: Negative.   Respiratory: Negative.   Cardiovascular: Negative.   Gastrointestinal: Negative.   Genitourinary: Negative.   Musculoskeletal: Negative.   Skin: Negative.   Neurological: Negative.   Hematological: Negative.   Psychiatric/Behavioral: Negative.        Objective:   Physical Exam BP 114/70  Pulse 86  Ht 5\' 2"  (1.575 m)  Wt 162 lb (73.483 kg)  BMI 29.63 kg/m2  General Appearance:    Alert, cooperative, no distress, appears stated age  Head:    Normocephalic, without obvious abnormality, atraumatic  Eyes:    PERRL, conjunctiva/corneas clear, EOM's intact, fundi    benign  Ears:    Normal TM's and external ear canals  Nose:   Nares normal, mucosa normal, no drainage or sinus   tenderness  Throat:   Lips, mucosa, and tongue normal; teeth and gums normal  Neck:   Supple, no lymphadenopathy;  thyroid:  no   enlargement/tenderness/nodules; no carotid   bruit or JVD  Back:    Spine nontender, no curvature, ROM normal, no CVA     tenderness  Lungs:     Clear to auscultation bilaterally without wheezes, rales or     ronchi; respirations unlabored  Chest Wall:    No tenderness or deformity   Heart:    Regular rate and rhythm, S1 and S2 normal, no murmur, rub   or gallop  Breast Exam:    Deferred to GYN  Abdomen:     Soft, non-tender, nondistended,  normoactive bowel sounds,    no masses, no hepatosplenomegaly  Genitalia:    Deferred to GYN     Extremities:   No clubbing, cyanosis or edema  Pulses:   2+ and symmetric all extremities  Skin:   Skin color, texture, turgor normal, no rashes or lesions  Lymph nodes:   Cervical, supraclavicular, and axillary nodes normal  Neurologic:   CNII-XII intact, normal strength, sensation and gait; reflexes 2+ and symmetric throughout          Psych:   Normal mood, affect, hygiene and grooming.           Assessment & Plan:  History of breast cancer. Osteoporosis. Hyperlipidemia. Obesity. Routine blood screening. Also encouraged her to become more physically active.

## 2011-06-12 ENCOUNTER — Telehealth: Payer: Self-pay

## 2011-06-12 LAB — CBC WITH DIFFERENTIAL/PLATELET
Eosinophils Absolute: 0.2 10*3/uL (ref 0.0–0.7)
Hemoglobin: 13.3 g/dL (ref 12.0–15.0)
Lymphocytes Relative: 46 % (ref 12–46)
Lymphs Abs: 2.5 10*3/uL (ref 0.7–4.0)
MCH: 29.9 pg (ref 26.0–34.0)
Monocytes Relative: 8 % (ref 3–12)
Neutrophils Relative %: 44 % (ref 43–77)
Platelets: 245 10*3/uL (ref 150–400)
RBC: 4.45 MIL/uL (ref 3.87–5.11)
WBC: 5.5 10*3/uL (ref 4.0–10.5)

## 2011-06-12 NOTE — Telephone Encounter (Signed)
Left message for pt labs look good

## 2011-07-04 ENCOUNTER — Other Ambulatory Visit: Payer: Self-pay | Admitting: Family Medicine

## 2011-08-06 ENCOUNTER — Encounter: Payer: BC Managed Care – PPO | Admitting: Oncology

## 2011-08-06 ENCOUNTER — Ambulatory Visit (HOSPITAL_COMMUNITY)
Admission: RE | Admit: 2011-08-06 | Discharge: 2011-08-06 | Disposition: A | Discharge status: Home / Self Care | Payer: BC Managed Care – PPO | Source: Ambulatory Visit | Attending: Oncology | Admitting: Oncology

## 2011-08-06 ENCOUNTER — Other Ambulatory Visit: Payer: Self-pay | Admitting: Oncology

## 2011-08-06 DIAGNOSIS — C50919 Malignant neoplasm of unspecified site of unspecified female breast: Secondary | ICD-10-CM | POA: Insufficient documentation

## 2011-08-06 LAB — CBC WITH DIFFERENTIAL/PLATELET
BASO%: 0.4 % (ref 0.0–2.0)
Basophils Absolute: 0 10*3/uL (ref 0.0–0.1)
EOS%: 2.7 % (ref 0.0–7.0)
HCT: 34.8 % (ref 34.8–46.6)
HGB: 12 g/dL (ref 11.6–15.9)
LYMPH%: 43.8 % (ref 14.0–49.7)
MCH: 31 pg (ref 25.1–34.0)
MCHC: 34.6 g/dL (ref 31.5–36.0)
MCV: 89.7 fL (ref 79.5–101.0)
MONO%: 9.4 % (ref 0.0–14.0)
NEUT%: 43.7 % (ref 38.4–76.8)
Platelets: 253 10*3/uL (ref 145–400)
lymph#: 3 10*3/uL (ref 0.9–3.3)

## 2011-08-06 LAB — COMPREHENSIVE METABOLIC PANEL
AST: 20 U/L (ref 0–37)
Albumin: 3.7 g/dL (ref 3.5–5.2)
BUN: 16 mg/dL (ref 6–23)
CO2: 27 mEq/L (ref 19–32)
Calcium: 9.3 mg/dL (ref 8.4–10.5)
Chloride: 103 mEq/L (ref 96–112)
Glucose, Bld: 111 mg/dL — ABNORMAL HIGH (ref 70–99)
Potassium: 3.9 mEq/L (ref 3.5–5.3)

## 2011-08-07 ENCOUNTER — Encounter: Payer: Self-pay | Admitting: Family Medicine

## 2011-08-08 ENCOUNTER — Other Ambulatory Visit: Payer: Self-pay | Admitting: Family Medicine

## 2011-08-09 NOTE — Telephone Encounter (Signed)
Medication refill

## 2011-08-13 ENCOUNTER — Encounter (HOSPITAL_BASED_OUTPATIENT_CLINIC_OR_DEPARTMENT_OTHER): Payer: BC Managed Care – PPO | Admitting: Oncology

## 2011-08-13 DIAGNOSIS — C50919 Malignant neoplasm of unspecified site of unspecified female breast: Secondary | ICD-10-CM

## 2011-08-22 ENCOUNTER — Other Ambulatory Visit: Payer: Self-pay | Admitting: Family Medicine

## 2011-08-22 NOTE — Telephone Encounter (Signed)
Med was refill

## 2011-09-05 ENCOUNTER — Other Ambulatory Visit: Payer: Self-pay | Admitting: Family Medicine

## 2011-09-14 ENCOUNTER — Other Ambulatory Visit: Payer: Self-pay | Admitting: Oncology

## 2011-11-16 ENCOUNTER — Other Ambulatory Visit: Payer: Self-pay | Admitting: Family Medicine

## 2011-12-09 ENCOUNTER — Other Ambulatory Visit: Payer: Self-pay | Admitting: Family Medicine

## 2012-01-30 ENCOUNTER — Other Ambulatory Visit: Payer: Self-pay | Admitting: Family Medicine

## 2012-01-31 ENCOUNTER — Telehealth: Payer: Self-pay | Admitting: Family Medicine

## 2012-01-31 MED ORDER — ALENDRONATE SODIUM 70 MG PO TABS: 70.0000 mg | ORAL_TABLET | ORAL | Status: DC

## 2012-01-31 NOTE — Telephone Encounter (Signed)
Done

## 2012-07-09 ENCOUNTER — Other Ambulatory Visit: Payer: Self-pay | Admitting: Family Medicine

## 2012-07-15 ENCOUNTER — Encounter (HOSPITAL_COMMUNITY): Payer: Self-pay | Admitting: Family Medicine

## 2012-07-15 ENCOUNTER — Other Ambulatory Visit: Payer: Self-pay | Admitting: Obstetrics and Gynecology

## 2012-07-15 ENCOUNTER — Emergency Department (HOSPITAL_COMMUNITY)
Admission: EM | Admit: 2012-07-15 | Discharge: 2012-07-15 | Disposition: A | Discharge status: Home / Self Care | Payer: Worker's Compensation | Attending: Emergency Medicine | Admitting: Emergency Medicine

## 2012-07-15 DIAGNOSIS — Z79899 Other long term (current) drug therapy: Secondary | ICD-10-CM | POA: Insufficient documentation

## 2012-07-15 DIAGNOSIS — M810 Age-related osteoporosis without current pathological fracture: Secondary | ICD-10-CM | POA: Insufficient documentation

## 2012-07-15 DIAGNOSIS — E785 Hyperlipidemia, unspecified: Secondary | ICD-10-CM | POA: Insufficient documentation

## 2012-07-15 DIAGNOSIS — Z885 Allergy status to narcotic agent status: Secondary | ICD-10-CM | POA: Insufficient documentation

## 2012-07-15 DIAGNOSIS — Z23 Encounter for immunization: Secondary | ICD-10-CM | POA: Insufficient documentation

## 2012-07-15 DIAGNOSIS — Z9010 Acquired absence of unspecified breast and nipple: Secondary | ICD-10-CM | POA: Insufficient documentation

## 2012-07-15 DIAGNOSIS — Z853 Personal history of malignant neoplasm of breast: Secondary | ICD-10-CM | POA: Insufficient documentation

## 2012-07-15 DIAGNOSIS — Z203 Contact with and (suspected) exposure to rabies: Secondary | ICD-10-CM

## 2012-07-15 MED ORDER — RABIES VACCINE, PCEC IM SUSR
1.0000 mL | Freq: Once | INTRAMUSCULAR | Status: AC
  Administered 2012-07-15: 1 mL via INTRAMUSCULAR
  Filled 2012-07-15: qty 1

## 2012-07-15 NOTE — ED Provider Notes (Signed)
History  This chart was scribed for Glynn Octave, MD by Ardeen Jourdain. This patient was seen in room TR09C/TR09C and the patient's care was started at 1544.  CSN: [id number]  Arrival date & time 07/15/12  1520   First MD Initiated Contact with Patient 07/15/12 1544      Chief Complaint  Patient presents with  . Rabies Injection     The history is provided by the patient. No language interpreter was used.    [name] is a 61 y.o. female who presents to the Emergency Department complaining of possible exposure to rabies, but denies being bitten. She denies fever, neck pain, sore throat, visual disturbance, CP, cough, SOB, abdominal pain, nausea, emesis, diarrhea, urinary symptoms, back pain, HA, weakness, numbness and rash as associated symptoms. She states that the last exposure was 14 days ago. She works in a [employer], product/process development, and states the [employer] puppy had seizures and died, the testing was performed but was inconclusive because the wrong part of the brain was tested. She states that the CDC recommends she starts the rabies series at this time. She denies any past medical history.     Past Medical History  Diagnosis Date  . Cancer     BREAST  . Dyslipidemia   . Diverticulitis   . Osteoporosis     Past Surgical History  Procedure Date  . Breast surgery     BILATERAL MASTECTOMY  . Joint replacement     5TH DIGIT(L) HAND AMPUTATION    Family History  Problem Relation Age of Onset  . Cancer Mother   . Diabetes Father   . Heart disease Father   . Hypertension Father   . Cancer Sister     History  Substance Use Topics  . Smoking status: Never Smoker   . Smokeless tobacco: Never Used  . Alcohol Use: No   No Ob History available.   Review of Systems  A complete 10 system review of systems was obtained and all systems are negative except as noted in the HPI and PMH.    Allergies  Percocet  Home Medications   Current Outpatient Rx  Name  Route Sig Dispense Refill  . ALENDRONATE SODIUM 70 MG PO TABS Oral Take 70 mg by mouth every 7 (seven) days. Take with a full glass of water on an empty stomach. On sunday    . CALCIUM CARBONATE 600 MG PO TABS Oral Take 600 mg by mouth 2 (two) times daily with a meal.      . FENOFIBRATE MICRONIZED 134 MG PO CAPS Oral Take 134 mg by mouth every evening.    Marland Kitchen GLUCOSAMINE-CHONDROITIN 250-200 MG PO TABS Oral Take 200 mg by mouth daily.      . MULTI-VITAMIN/MINERALS PO TABS Oral Take 1 tablet by mouth daily.      Marland Kitchen ROSUVASTATIN CALCIUM 20 MG PO TABS Oral Take 20 mg by mouth every evening.    Marland Kitchen TAMOXIFEN CITRATE 20 MG PO TABS Oral Take 20 mg by mouth daily.    Marland Kitchen VITAMIN D (ERGOCALCIFEROL) 50000 UNITS PO CAPS Oral Take 50,000 Units by mouth every 7 (seven) days. On wednesday      Triage Vitals: BP 137/95  Pulse 95  Temp 99 F (37.2 C)  Resp 18  SpO2 96%  Physical Exam  Nursing note and vitals reviewed. Constitutional: She is oriented to person, place, and time. She appears well-developed and well-nourished. No distress.  HENT:  Head: Normocephalic and atraumatic.  Eyes: EOM are normal.  Neck: Normal range of motion. Neck supple. No tracheal deviation present.  Cardiovascular: Normal rate, regular rhythm and normal heart sounds.   Pulmonary/Chest: Effort normal and breath sounds normal. No respiratory distress.  Musculoskeletal: Normal range of motion.  Neurological: She is alert and oriented to person, place, and time.  Skin: Skin is warm and dry.  Psychiatric: She has a normal mood and affect. Her behavior is normal.    ED Course  Procedures (including critical care time)  DIAGNOSTIC STUDIES: Oxygen Saturation is 96% on room air, adequate by my interpretation.    COORDINATION OF CARE:  1558- Discussed treatment plan with pt at bedside and pt agreed to plan. Pt will receive 1st and 3rd days of rabies series, she has received the full series in the past. She states that she has here  titers checked regularly and have always been normal.   16:15-Medication Orders: Rabies vaccine, PCEC (Rabavert) injection 1 mL-once   Labs Reviewed - No data to display No results found.   No diagnosis found.    MDM  Asymptomatic exposure to possible rabies. 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  CDC recommends treatment. Has had previous rabies vaccination.  Will give 2 dose booster.  2nd Dose on day 3. D/w poison center.   Glynn Octave, MD 07/15/12 [phone]

## 2012-07-15 NOTE — ED Notes (Signed)
Pt sts exposed to rabies. Pt not bitten

## 2012-07-17 NOTE — ED Notes (Signed)
[facility] of injection record [facility]ed to [facility] and [facility]

## 2012-07-18 ENCOUNTER — Emergency Department (INDEPENDENT_AMBULATORY_CARE_PROVIDER_SITE_OTHER)
Admission: EM | Admit: 2012-07-18 | Discharge: 2012-07-18 | Disposition: A | Discharge status: Home / Self Care | Payer: Worker's Compensation | Source: Home / Self Care

## 2012-07-18 ENCOUNTER — Encounter (HOSPITAL_COMMUNITY): Payer: Self-pay

## 2012-07-18 DIAGNOSIS — Z23 Encounter for immunization: Secondary | ICD-10-CM

## 2012-07-18 MED ORDER — RABIES VACCINE, PCEC IM SUSR
INTRAMUSCULAR | Status: AC
  Filled 2012-07-18: qty 1

## 2012-07-18 MED ORDER — RABIES VACCINE, PCEC IM SUSR
1.0000 mL | Freq: Once | INTRAMUSCULAR | Status: AC
  Administered 2012-07-18: 1 mL via INTRAMUSCULAR

## 2012-07-18 NOTE — ED Notes (Signed)
Rabies shot in series

## 2012-07-22 ENCOUNTER — Telehealth: Payer: Self-pay | Admitting: Family Medicine

## 2012-07-22 NOTE — Telephone Encounter (Signed)
I see no reason to hold on the flu shot and let her know for me

## 2012-07-23 NOTE — Telephone Encounter (Signed)
Called pt back and let her know flu shot was ok to get

## 2012-07-29 ENCOUNTER — Encounter: Payer: Self-pay | Admitting: Internal Medicine

## 2012-08-04 ENCOUNTER — Ambulatory Visit (HOSPITAL_COMMUNITY)
Admission: RE | Admit: 2012-08-04 | Discharge: 2012-08-04 | Disposition: A | Discharge status: Home / Self Care | Payer: BC Managed Care – PPO | Source: Ambulatory Visit | Attending: Oncology | Admitting: Oncology

## 2012-08-04 ENCOUNTER — Other Ambulatory Visit: Payer: Self-pay | Admitting: Oncology

## 2012-08-04 ENCOUNTER — Other Ambulatory Visit: Payer: Self-pay | Admitting: *Deleted

## 2012-08-04 ENCOUNTER — Other Ambulatory Visit (HOSPITAL_BASED_OUTPATIENT_CLINIC_OR_DEPARTMENT_OTHER): Payer: BC Managed Care – PPO | Admitting: Lab

## 2012-08-04 DIAGNOSIS — Z853 Personal history of malignant neoplasm of breast: Secondary | ICD-10-CM

## 2012-08-04 DIAGNOSIS — R52 Pain, unspecified: Secondary | ICD-10-CM

## 2012-08-04 DIAGNOSIS — R079 Chest pain, unspecified: Secondary | ICD-10-CM | POA: Insufficient documentation

## 2012-08-04 DIAGNOSIS — Z87891 Personal history of nicotine dependence: Secondary | ICD-10-CM | POA: Insufficient documentation

## 2012-08-04 LAB — CBC WITH DIFFERENTIAL/PLATELET
BASO%: 0.5 % (ref 0.0–2.0)
HCT: 37.1 % (ref 34.8–46.6)
LYMPH%: 39.3 % (ref 14.0–49.7)
MCHC: 33.2 g/dL (ref 31.5–36.0)
MCV: 89.7 fL (ref 79.5–101.0)
MONO#: 0.7 10*3/uL (ref 0.1–0.9)
MONO%: 8.1 % (ref 0.0–14.0)
NEUT%: 50.3 % (ref 38.4–76.8)
Platelets: 257 10*3/uL (ref 145–400)
WBC: 8.4 10*3/uL (ref 3.9–10.3)

## 2012-08-04 LAB — COMPREHENSIVE METABOLIC PANEL (CC13)
CO2: 23 mEq/L (ref 22–29)
Creatinine: 0.8 mg/dL (ref 0.6–1.1)
Glucose: 83 mg/dl (ref 70–99)
Total Bilirubin: 0.4 mg/dL (ref 0.20–1.20)

## 2012-08-05 LAB — CANCER ANTIGEN 27.29: CA 27.29: 25 U/mL (ref 0–39)

## 2012-08-11 ENCOUNTER — Ambulatory Visit (HOSPITAL_BASED_OUTPATIENT_CLINIC_OR_DEPARTMENT_OTHER): Payer: BC Managed Care – PPO | Admitting: Oncology

## 2012-08-11 ENCOUNTER — Telehealth: Payer: Self-pay | Admitting: *Deleted

## 2012-08-11 VITALS — BP 133/78 | HR 72 | Temp 98.5°F | Resp 20 | Ht 62.0 in | Wt 163.5 lb

## 2012-08-11 DIAGNOSIS — C50419 Malignant neoplasm of upper-outer quadrant of unspecified female breast: Secondary | ICD-10-CM

## 2012-08-11 DIAGNOSIS — M810 Age-related osteoporosis without current pathological fracture: Secondary | ICD-10-CM

## 2012-08-11 DIAGNOSIS — Z170 Estrogen receptor positive status [ER+]: Secondary | ICD-10-CM

## 2012-08-11 DIAGNOSIS — C50919 Malignant neoplasm of unspecified site of unspecified female breast: Secondary | ICD-10-CM

## 2012-08-11 MED ORDER — TAMOXIFEN CITRATE 20 MG PO TABS: 20.0000 mg | ORAL_TABLET | Freq: Every day | ORAL | Status: DC

## 2012-08-11 NOTE — Progress Notes (Signed)
ID: Enis Slipper   DOB: 12-22-1950  MR#: [id number]  WUJ#:[id number]  PCP: Carollee Herter, MD GYN: Nestor Ramp SU:  OTHER MD:   HISTORY OF PRESENT ILLNESS: Lucila had a routine screening mammogram at [facility] on 10/13/03. She had further views with right ultrasound on November 16, 2003 and on that day, she had a biopsy which confirmed the presence of an invasive breast cancer which was ER positive, PR 25% positive and HER-2  2+.  FISH was done and it showed borderline implication by FISH with a ratio of 2.0, HER-2/CEP 17.    With this information, the patient was evaluated by Cicero Duck. The patient was fairly adamant that she wanted to have bilateral mastectomies.  Her mother had died from cancer before the age of 26 and her sister had bilateral mastectomies for DCIS.    Accordingly, this was done on February 16th and the final pathology ([id number]) showed on the right a grade 3 infiltrating ductal carcinoma with 1 of 3 sentinel lymph nodes positive. Accordingly, Dr. Jamey Ripa proceeded to full axillary lymph node dissection and there were 2 out of 14 lymph nodes involved.  The left breast was negative and no sentinel lymph nodes sampling was done there. Her subsequent history is as detailed below.  INTERVAL HISTORY: Melainie returns for followup of her breast cancer. Interval history is unremarkable. She continues to work as a Metallurgist, and recently required some rabies booster shots. She exercises by walking, and she pretty much walks everywhere (to work, to the store, and to her volunteer decision at the [employer]).Marland Kitchen  REVIEW OF SYSTEMS: She has fairly chronic back and joint pain which has not increased in frequency or intensity. A detailed review of systems was otherwise entirely negative today  PAST MEDICAL HISTORY: Past Medical History  Diagnosis Date  . Cancer     BREAST  . Dyslipidemia   . Diverticulitis   . Osteoporosis     osteoarthritis, minimal Hx tobacco abuse, Hx MVP  PAST SURGICAL HISTORY: Past Surgical History  Procedure Date  . Breast surgery     BILATERAL MASTECTOMY  . Joint replacement     5TH DIGIT(L) HAND AMPUTATION    FAMILY HISTORY Family History  Problem Relation Age of Onset  . Cancer Mother   . Diabetes Father   . Heart disease Father   . Hypertension Father   . Cancer Sister   The patient's father died at the age of 71 from "old age".  The patient's mother was diagnosed with breast cancer at the age of 36.  It was rapidly metastatic and she succumbed within two years.   The patient has a half-sister, Darl Pikes, with spina bifida. She is now 58 and has not had a mammogram and is not planning to have one.  Another half sister, Dewayne Hatch, has DCIS and is status post bilateral mastectomy.  The patient has one full brother, Annette Stable, with a hx of hemochromatosis. Aziah tested negative).  He works for [employer], [employer].  GYNECOLOGIC HISTORY: She is G1 P1.    She was 61 years old at the time, and put up the child (a daughter) for adoption.  The patient had the change of life about five 2000. She never took hormone replacement  SOCIAL HISTORY: She works for [employer] as a Production designer, theatre/television/film. She was married for one year in 1978. She lives by herself, three snakes and a bearded dragon.    ADVANCED DIRECTIVES:  HEALTH MAINTENANCE: History  Substance Use Topics  . Smoking status: Never Smoker   . Smokeless tobacco: Never Used  . Alcohol Use: No     Colonoscopy:  PAP:  Bone density:  Lipid panel:  Allergies  Allergen Reactions  . Percocet (Oxycodone-Acetaminophen)     Current Outpatient Prescriptions  Medication Sig Dispense Refill  . alendronate (FOSAMAX) 70 MG tablet Take 70 mg by mouth every 7 (seven) days. Take with a full glass of water on an empty stomach. On sunday      . calcium carbonate (OS-CAL) 600 MG TABS Take 600 mg by mouth 2 (two) times daily with a meal.        .  fenofibrate micronized (LOFIBRA) 134 MG capsule Take 134 mg by mouth every evening.      . Glucosamine-Chondroitin (OSTEO BI-FLEX REGULAR STRENGTH) 250-200 MG TABS Take 200 mg by mouth daily.        . Multiple Vitamins-Minerals (MULTIVITAMIN WITH MINERALS) tablet Take 1 tablet by mouth daily.        . rosuvastatin (CRESTOR) 20 MG tablet Take 20 mg by mouth every evening.      . tamoxifen (NOLVADEX) 20 MG tablet Take 1 tablet (20 mg total) by mouth daily.  90 tablet  12  . tamoxifen (NOLVADEX) 20 MG tablet Take 1 tablet (20 mg total) by mouth daily.  90 tablet  12  . Vitamin D, Ergocalciferol, (DRISDOL) 50000 UNITS CAPS Take 50,000 Units by mouth every 7 (seven) days. On wednesday        OBJECTIVE: Middle-aged white woman who appears well Filed Vitals:   08/11/12 1600  BP: 133/78  Pulse: 72  Temp: 98.5 F (36.9 C)  Resp: 20     Body mass index is 29.90 kg/(m^2).    ECOG FS: 0  Sclerae unicteric Oropharynx clear No cervical or supraclavicular adenopathy Lungs no rales or rhonchi Heart regular rate and rhythm Abd benign MSK no focal spinal tenderness, no peripheral edema Neuro: nonfocal Breasts: She status post bilateral mastectomies. There is no evidence of local recurrence.   LAB RESULTS: Lab Results  Component Value Date   WBC 8.4 08/04/2012   NEUTROABS 4.2 08/04/2012   HGB 12.3 08/04/2012   HCT 37.1 08/04/2012   MCV 89.7 08/04/2012   PLT 257 08/04/2012      Chemistry      Component Value Date/Time   NA 138 08/04/2012 1623   NA 140 08/06/2011 1555   K 3.6 08/04/2012 1623   K 3.9 08/06/2011 1555   CL 104 08/04/2012 1623   CL 103 08/06/2011 1555   CO2 23 08/04/2012 1623   CO2 27 08/06/2011 1555   BUN 20.0 08/04/2012 1623   BUN 16 08/06/2011 1555   CREATININE 0.8 08/04/2012 1623   CREATININE 0.65 08/06/2011 1555   CREATININE 0.68 06/11/2011 1047      Component Value Date/Time   CALCIUM 9.6 08/04/2012 1623   CALCIUM 9.3 08/06/2011 1555   ALKPHOS 47 08/04/2012  1623   ALKPHOS 66 08/06/2011 1555   AST 23 08/04/2012 1623   AST 20 08/06/2011 1555   ALT 19 08/04/2012 1623   ALT 17 08/06/2011 1555   BILITOT 0.40 08/04/2012 1623   BILITOT 0.1* 08/06/2011 1555       Lab Results  Component Value Date   LABCA2 25 08/04/2012    No components found with this basename: [id number]    No results found for this basename: INR:1;PROTIME:1 in the last 168 hours  Urinalysis  Component Value Date/Time   BILIRUBINUR n 06/11/2011 0821   UROBILINOGEN n 06/11/2011 0821   NITRITE n 06/11/2011 0821   LEUKOCYTESUR n 06/11/2011 0821    STUDIES: Dg Chest 2 View  08/04/2012  *RADIOLOGY REPORT*  Clinical Data: Chest pain.  History of breast cancer.  CHEST - 2 VIEW  Comparison: 08/06/2011 and prior chest radiographs  Findings: The cardiomediastinal silhouette is unremarkable. There is no evidence of focal airspace disease, pulmonary edema, suspicious pulmonary nodule/mass, pleural effusion, or pneumothorax. No acute bony abnormalities are identified. Surgical clips in the right axilla are again noted.  IMPRESSION: No evidence of active cardiopulmonary disease.   Original Report Authenticated By: Rosendo Gros, M.D.     ASSESSMENT: 61 y.o. [place] woman status post bilateral mastectomies February 2005 for a right-sided invasive ductal carcinoma, T2 N1 (stage IIB), grade 3, triple positive, treated with paclitaxel and trastuzumab x12, followed by doxorubicin and cyclophosphamide x4, completed August 2005, the trastuzumab continued until August 2006.  She was on exemestane between September 2005 and October 2010, at which time she started on tamoxifen.  She does have a history of osteoporosis, on alendronate, with an improved bone density.   PLAN: She is doing terrific from my point of view. She will see [place] again in one year and then again 2 years from now. At that time we will stop tamoxifen and likely discontinue followup here. She knows to call for any problems that  may develop before that visit.  I do not believe that we have tested [facility] for the BRCA genes. Reviewing her family history, I think this may be indicated. I am setting her up to meet with our genetic counselor, next available.   Masayoshi Couzens C    08/11/2012

## 2012-08-11 NOTE — Telephone Encounter (Signed)
Gave patient appointment appointment for 2014 lab one week before the md appointment gave patient instructions on getting her chest x-ray done

## 2012-08-14 ENCOUNTER — Ambulatory Visit
Admission: RE | Admit: 2012-08-14 | Discharge: 2012-08-14 | Disposition: A | Discharge status: Home / Self Care | Payer: BC Managed Care – PPO | Source: Ambulatory Visit | Attending: Obstetrics and Gynecology | Admitting: Obstetrics and Gynecology

## 2012-08-14 DIAGNOSIS — M810 Age-related osteoporosis without current pathological fracture: Secondary | ICD-10-CM

## 2012-09-01 ENCOUNTER — Encounter: Payer: Self-pay | Admitting: Internal Medicine

## 2012-09-04 ENCOUNTER — Encounter: Payer: Self-pay | Admitting: Family Medicine

## 2012-09-04 ENCOUNTER — Ambulatory Visit (INDEPENDENT_AMBULATORY_CARE_PROVIDER_SITE_OTHER): Payer: BC Managed Care – PPO | Admitting: Family Medicine

## 2012-09-04 VITALS — BP 120/74 | HR 78 | Ht 63.0 in | Wt 164.0 lb

## 2012-09-04 DIAGNOSIS — Z853 Personal history of malignant neoplasm of breast: Secondary | ICD-10-CM

## 2012-09-04 DIAGNOSIS — E785 Hyperlipidemia, unspecified: Secondary | ICD-10-CM

## 2012-09-04 DIAGNOSIS — Z0000 Encounter for general adult medical examination without abnormal findings: Secondary | ICD-10-CM

## 2012-09-04 DIAGNOSIS — T50904A Poisoning by unspecified drugs, medicaments and biological substances, undetermined, initial encounter: Secondary | ICD-10-CM

## 2012-09-04 DIAGNOSIS — M818 Other osteoporosis without current pathological fracture: Secondary | ICD-10-CM

## 2012-09-04 LAB — LIPID PANEL
HDL: 37 mg/dL — ABNORMAL LOW (ref >39)
Triglycerides: 252 mg/dL — ABNORMAL HIGH (ref <150)

## 2012-09-04 NOTE — Progress Notes (Signed)
  Subjective:    Patient ID: [name], female    DOB: 03-17-1951, 61 y.o.   MRN: [id number]  HPI She is here for a complete examination. She does have a history of breast cancer and recently saw her oncologist. She is on medications listed in the chart and is having no difficulty with them. She has no particular concerns or complaints. Social and family history were reviewed. She has recently seen her gynecologist the   Review of Systems  Constitutional: Negative.   HENT: Negative.   Eyes: Negative.   Respiratory: Negative.   Cardiovascular: Negative.   Gastrointestinal: Negative.   Genitourinary: Negative.   Musculoskeletal: Negative.   Neurological: Negative.   Hematological: Negative.   Psychiatric/Behavioral: Negative.        Objective:   Physical Exam BP 120/74  Pulse 78  Ht 5\' 3"  (1.6 m)  Wt 164 lb (74.39 kg)  BMI 29.05 kg/m2  SpO2 97%  General Appearance:    Alert, cooperative, no distress, appears stated age  Head:    Normocephalic, without obvious abnormality, atraumatic  Eyes:    PERRL, conjunctiva/corneas clear, EOM's intact, fundi    benign  Ears:    Normal TM's and external ear canals  Nose:   Nares normal, mucosa normal, no drainage or sinus   tenderness  Throat:   Lips, mucosa, and tongue normal; teeth and gums normal  Neck:   Supple, no lymphadenopathy;  thyroid:  no   enlargement/tenderness/nodules; no carotid   bruit or JVD  Back:    Spine nontender, no curvature, ROM normal, no CVA     tenderness  Lungs:     Clear to auscultation bilaterally without wheezes, rales or     ronchi; respirations unlabored  Chest Wall:    No tenderness or deformity   Heart:    Regular rate and rhythm, S1 and S2 normal, no murmur, rub   or gallop  Breast Exam:    Deferred to GYN  Abdomen:     Soft, non-tender, nondistended, normoactive bowel sounds,    no masses, no hepatosplenomegaly  Genitalia:    Deferred to GYN     Extremities:   No clubbing, cyanosis or  edema  Pulses:   2+ and symmetric all extremities  Skin:   Skin color, texture, turgor normal, no rashes or lesions  Lymph nodes:   Cervical, supraclavicular, and axillary nodes normal  Neurologic:   CNII-XII intact, normal strength, sensation and gait; reflexes 2+ and symmetric throughout          Psych:   Normal mood, affect, hygiene and grooming.           Assessment & Plan:   1. History of breast cancer    2. Drug-induced osteoporosis    3. Hyperlipidemia LDL goal < 100  Lipid panel  4. Routine general medical examination at a health care facility  Lipid panel   encouraged her to continue to take good care of herself.

## 2012-09-07 NOTE — Progress Notes (Signed)
Quick Note:  Called pt left message lipids ok continue present med ______

## 2012-09-09 ENCOUNTER — Other Ambulatory Visit: Payer: Self-pay | Admitting: Family Medicine

## 2012-10-05 ENCOUNTER — Other Ambulatory Visit: Payer: Self-pay | Admitting: Family Medicine

## 2012-11-05 ENCOUNTER — Other Ambulatory Visit: Payer: Self-pay | Admitting: Oncology

## 2012-11-23 ENCOUNTER — Other Ambulatory Visit: Payer: BC Managed Care – PPO | Admitting: Lab

## 2012-11-23 ENCOUNTER — Ambulatory Visit (HOSPITAL_BASED_OUTPATIENT_CLINIC_OR_DEPARTMENT_OTHER): Payer: BC Managed Care – PPO | Admitting: Genetic Counselor

## 2012-11-23 DIAGNOSIS — Z803 Family history of malignant neoplasm of breast: Secondary | ICD-10-CM

## 2012-11-23 DIAGNOSIS — Z809 Family history of malignant neoplasm, unspecified: Secondary | ICD-10-CM

## 2012-11-23 DIAGNOSIS — C50419 Malignant neoplasm of upper-outer quadrant of unspecified female breast: Secondary | ICD-10-CM

## 2012-11-23 DIAGNOSIS — Z853 Personal history of malignant neoplasm of breast: Secondary | ICD-10-CM

## 2012-11-23 DIAGNOSIS — IMO0002 Reserved for concepts with insufficient information to code with codable children: Secondary | ICD-10-CM

## 2012-11-24 ENCOUNTER — Encounter: Payer: Self-pay | Admitting: Genetic Counselor

## 2012-11-24 NOTE — Progress Notes (Signed)
Dr.  Raymond Gurney Cole requested a consultation for genetic counseling and risk assessment for [name], a 62 y.o. female, for discussion of her personal history of breast cancer and family history of breast cancer. She presents to clinic today to discuss the possibility of a genetic predisposition to cancer, and to further clarify her risks, as well as her family members' risks for cancer.   HISTORY OF PRESENT ILLNESS: In 2006, at the age of 63, [name] was diagnosed with triple positive breast cancer. This was treated with herceptin, chemotherapy and bilateral mastectomy.    Past Medical History  Diagnosis Date  . Cancer     BREAST  . Dyslipidemia   . Diverticulitis   . Osteoporosis   . Breast cancer 2006    Past Surgical History  Procedure Date  . Breast surgery     BILATERAL MASTECTOMY  . Joint replacement     5TH DIGIT(L) HAND AMPUTATION  . Colonoscopy 2008    Dr.Mann    History  Substance Use Topics  . Smoking status: Former Smoker -- 2.5 packs/day for 10 years    Types: Cigarettes    Quit date: 11/24/1977  . Smokeless tobacco: Never Used  . Alcohol Use: No    REPRODUCTIVE HISTORY AND PERSONAL RISK ASSESSMENT FACTORS: Menarche was at age 60.   Menopause at 43 Uterus Intact: Yes Ovaries Intact: Yes G1P1A0 , first live birth at age 53  She has not previously undergone treatment for infertility.   OCP use for 10 years   She has not used HRT in the past.    FAMILY HISTORY:  We obtained a detailed, 4-generation family history.  Significant diagnoses are listed below: Family History  Problem Relation Age of Onset  . Breast cancer Mother 46  . Diabetes Father   . Heart disease Father   . Hypertension Father   . Breast cancer Sister     DCIS dx in her 73s  . Hemochromatosis Brother   . Cancer Maternal Aunt     unknown type  . Cancer Maternal Uncle     sinus cavity  . Spina bifida Sister   The patient was diagnosed with triple positive  breast cancer at age 76.  Her mother was diangosed with breast cancer at age 47 and died at 82, and a maternal half sister was diagnosed with breast cancer in her 37s and had a double mastectomy.  The patient's maternal aunt died of an unknown cancer over the age of 65 and her maternal uncle was diagnosed with cancer of the sinus' at age 46.  Patient's maternal ancestors are of [place] descent, and paternal ancestors are of English descent. There is no reported Ashkenazi Jewish ancestry. There is no  known consanguinity.  GENETIC COUNSELING RISK ASSESSMENT, DISCUSSION, AND SUGGESTED FOLLOW UP: We reviewed the natural history and genetic etiology of sporadic, familial and hereditary cancer syndromes.  About 5-10% of breast cancer is hereditary.  Of this, about 85% is the result of a BRCA1 or BRCA2 mutation.  We reviewed the red flags of hereditary cancer syndromes and the dominant inheritance patterns.  If the BRCA testing is negative, we discussed that we could be testing for the wrong gene.  We discussed gene panels, and that several cancer genes that are associated with different cancers can be tested at the same time.  Because of the different types of cancer that are in the patient's family, we will consider one of the panel tests if  she is negative for BRCA mutations.   The patient's personal and family history of breast cancer is suggestive of the following possible diagnosis: hereditary cancer syndrome  We discussed that identification of a hereditary cancer syndrome may help her care providers tailor the patients medical management. If a mutation indicating a hereditary cancer syndrome is detected in this case, the [employer] recommendations would include increased cancer surveillance and possible prophylactic surgery. If a mutation is detected, the patient will be referred back to the referring provider and to any additional appropriate care providers to discuss the  relevant options.   If a mutation is not found in the patient, this will decrease the likelihood of a hereditary cancer syndrome as the explanation for her breast cancer. Cancer surveillance options would be discussed for the patient according to the appropriate standard National Comprehensive Cancer Network and American Cancer Society guidelines, with consideration of their personal and family history risk factors. In this case, the patient will be referred back to their care providers for discussions of management.   In order to estimate her chance of having a BRCA mutation, we used statistical models (Penn II) and laboratory data that take into account her personal medical history, family history and ancestry.  Because each model is different, there can be a lot of variability in the risks they give.  Therefore, these numbers must be considered a rough range and not a precise risk of having a BRCA mutation.  These models estimate that she has approximately a 14% chance of having a mutation. Based on this assessment of her family and personal history, genetic testing is recommended.  After considering the risks, benefits, and limitations, the patient provided informed consent for  the following  testing: BRACAnalysis and MyRisk through [employer].   Per the patient's request, we will contact her by telephone to discuss these results. A follow up genetic counseling visit will be scheduled if indicated.  The patient was seen for a total of 60 minutes, greater than 50% of which was spent face-to-face counseling.  This plan is being carried out per Dr. Raymond Gurney Cole's recommendations.  This note will also be sent to the referring provider via the electronic medical record. The patient will be supplied with a summary of this genetic counseling discussion as well as educational information on the discussed hereditary cancer syndromes following the conclusion of their visit.   Patient was  discussed with Dr. [name].   _______________________________________________________________________ For Office Staff:  Number of people involved in session: 3 Was an Intern/ student involved with case: yes

## 2012-11-27 ENCOUNTER — Encounter: Payer: Self-pay | Admitting: Genetic Counselor

## 2012-12-07 ENCOUNTER — Telehealth: Payer: Self-pay | Admitting: Genetic Counselor

## 2012-12-07 NOTE — Telephone Encounter (Signed)
Left good news message on test results and to please CB.

## 2012-12-16 ENCOUNTER — Encounter: Payer: Self-pay | Admitting: Genetic Counselor

## 2013-02-18 ENCOUNTER — Other Ambulatory Visit: Payer: Self-pay | Admitting: Family Medicine

## 2013-04-07 ENCOUNTER — Encounter: Payer: Self-pay | Admitting: *Deleted

## 2013-04-20 ENCOUNTER — Ambulatory Visit: Payer: Self-pay | Admitting: Nurse Practitioner

## 2013-05-25 ENCOUNTER — Encounter: Payer: Self-pay | Admitting: Nurse Practitioner

## 2013-05-25 ENCOUNTER — Ambulatory Visit (INDEPENDENT_AMBULATORY_CARE_PROVIDER_SITE_OTHER): Payer: 59 | Admitting: Nurse Practitioner

## 2013-05-25 VITALS — BP 120/70 | HR 80 | Ht 62.75 in | Wt 158.0 lb

## 2013-05-25 DIAGNOSIS — Z0000 Encounter for general adult medical examination without abnormal findings: Secondary | ICD-10-CM

## 2013-05-25 DIAGNOSIS — E559 Vitamin D deficiency, unspecified: Secondary | ICD-10-CM

## 2013-05-25 DIAGNOSIS — Z01419 Encounter for gynecological examination (general) (routine) without abnormal findings: Secondary | ICD-10-CM

## 2013-05-25 LAB — POCT URINALYSIS DIPSTICK
Bilirubin, UA: NEGATIVE
Blood, UA: NEGATIVE
Glucose, UA: NEGATIVE
Ketones, UA: NEGATIVE

## 2013-05-25 MED ORDER — VITAMIN D (ERGOCALCIFEROL) 1.25 MG (50000 UNIT) PO CAPS: 50000.0000 [IU] | ORAL_CAPSULE | ORAL | Status: DC

## 2013-05-25 NOTE — Progress Notes (Signed)
Patient ID: [name], female   DOB: Jan 25, 1951, 62 y.o.   MRN: [id number] 62 y.o. G1P1 Single Caucasian Fe here for annual exam.  Feels well without changes in health status. Urine today with trace WBC but she has no urinary symptoms. No urgency, dysuria,  fever chills.  No LMP recorded. Patient is postmenopausal.          Sexually active: no  The current method of family planning is none.    Exercising: yes  walks to work daily Smoker:  yes  Health Maintenance: Pap:  04/17/12, WNL, neg HR HPV MMG:  Mastectomy, CXR 08/04/12, normal Colonoscopy:  02/27/07, normal, repeat in 10 years  BMD:   08/14/12, low bone mass TDaP:  2010 [facility]: [facility]   Urine: 1+ WBC, trace protein   reports that she quit smoking about 35 years ago. Her smoking use included Cigarettes. She has a 25 pack-year smoking history. She has never used smokeless tobacco. She reports that she does not drink alcohol or use illicit drugs.  Past Medical History  Diagnosis Date  . Cancer     BREAST  . Dyslipidemia   . Diverticulitis   . Osteoporosis   . Breast cancer 2006  . Mitral valve prolapse   . Myoma     pedunculated myoma  . Hypercholesterolemia   . Fibroids     multiple  . Menopausal symptoms   . Breast cancer     invasive ductal breast cancer  . AVM (arteriovenous malformation) brain     Past Surgical History  Procedure Laterality Date  . Breast surgery      BILATERAL MASTECTOMY  . Joint replacement      5TH DIGIT(L) HAND AMPUTATION  . Colonoscopy  2008    Dr.Mann  . Cervical ectopy  04/1987  05/1988    benign  . Masectomy Bilateral     invasive ductal breast cancer  . Finger amputation Left     left fifth finger due to dog bite    Current Outpatient Prescriptions  Medication Sig Dispense Refill  . alendronate (FOSAMAX) 70 MG tablet TAKE 1 TABLET BY MOUTH EVERY 7 DAYS . TAKE WITH A FULL GLASS OF WATER ON AN EMPTY STOMACH  4 tablet  11  . calcium carbonate (OS-CAL) 600 MG TABS Take  600 mg by mouth 2 (two) times daily with a meal.        . fenofibrate micronized (LOFIBRA) 134 MG capsule TAKE 1 CAPSULE BY MOUTH DAILY  30 capsule  5  . Multiple Vitamins-Minerals (MULTIVITAMIN WITH MINERALS) tablet Take 1 tablet by mouth daily.        . rosuvastatin (CRESTOR) 20 MG tablet Take 20 mg by mouth every evening.      . tamoxifen (NOLVADEX) 20 MG tablet Take 1 tablet (20 mg total) by mouth daily.  90 tablet  12  . Vitamin D, Ergocalciferol, (DRISDOL) 50000 UNITS CAPS Take 50,000 Units by mouth every 7 (seven) days. On wednesday      . aspirin 81 MG tablet Take 81 mg by mouth daily.       No current facility-administered medications for this visit.    Family History  Problem Relation Age of Onset  . Breast cancer Mother 68  . Diabetes Father   . Heart disease Father   . Hypertension Father   . Breast cancer Sister     DCIS dx in her 59s  . Hemochromatosis Brother   . Cancer Maternal Aunt  unknown type  . Cancer Maternal Uncle     sinus cavity  . Spina bifida Sister     ROS:  Pertinent items are noted in HPI.  Otherwise, a comprehensive ROS was negative.  Exam:   BP 120/70  Pulse 80  Ht 5' 2.75" (1.594 m)  Wt 158 lb (71.668 kg)  BMI 28.21 kg/m2 Height: 5' 2.75" (159.4 cm)  Ht Readings from Last 3 Encounters:  05/25/13 5' 2.75" (1.594 m)  09/04/12 5\' 3"  (1.6 m)  08/11/12 5\' 2"  (1.575 m)    General appearance: alert, cooperative and appears stated age Head: Normocephalic, without obvious abnormality, atraumatic Neck: no adenopathy, supple, symmetrical, trachea midline and thyroid normal to inspection and palpation Lungs: clear to auscultation bilaterally Breasts: bilateral mastecomies with any sign of edema or mass. Heart: regular rate and rhythm Abdomen: soft, non-tender; no masses,  no organomegaly Extremities: extremities normal, atraumatic, no cyanosis or edema Skin: Skin color, texture, turgor normal. No rashes or lesions Lymph nodes: Cervical,  supraclavicular, and axillary nodes normal. No abnormal inguinal nodes palpated Neurologic: Grossly normal   Pelvic: External genitalia:  no lesions              Urethra:  normal appearing urethra with no masses, tenderness or lesions              Bartholin's and Skene's: normal                 Vagina: normal appearing vagina with normal color and discharge, no lesions              Cervix: anteverted              Pap taken: no Bimanual Exam:  Uterus:  normal size, contour, position, consistency, mobility, non-tender              Adnexa: no mass, fullness, tenderness               Rectovaginal: Confirms               Anus:  normal sphincter tone, no lesions  A:  Well Woman with normal exam  S/P Bilateral Mastectomies secondary to breast cancer 10/2003  Osteoporosis  Remote history of abnormal pap 7/88, 8/89 with benign colpo  History of uterine fibroids  Hypercholesterolemia  Vit D deficiency    P:   Pap smear as per guidelines   Mammogram not done  OC light given today  Counseled on breast self exam, osteoporosis, adequate intake of calcium and vitamin D, diet and exercise return annually or prn  An After Visit Summary was printed and given to the patient.

## 2013-05-25 NOTE — Patient Instructions (Signed)

## 2013-05-27 NOTE — Progress Notes (Signed)
Encounter reviewed by Dr. Conley Simmonds.  OC light is name of fecal occult blood test kit.

## 2013-05-31 ENCOUNTER — Telehealth: Payer: Self-pay | Admitting: *Deleted

## 2013-05-31 NOTE — Telephone Encounter (Signed)
Pt notified of results

## 2013-05-31 NOTE — Telephone Encounter (Signed)
Message copied by Luisa Dago on Mon May 31, 2013  2:13 PM ------      Message from: Ria Comment R      Created: Wed May 26, 2013  8:25 PM       Let patient know results ------

## 2013-06-14 ENCOUNTER — Telehealth: Payer: Self-pay | Admitting: Genetic Counselor

## 2013-06-14 NOTE — Telephone Encounter (Signed)
Patient had called earlier in the day and left a message on my VM.  I returned the call, but got her VM.  Left message indicating that the check she received from [employer] should be forwarded to [facility] to cover the cost of her testing.  I left the phone number for [facility] so she could contact them if needed.  [phone].

## 2013-08-02 ENCOUNTER — Encounter: Payer: Self-pay | Admitting: Internal Medicine

## 2013-08-04 ENCOUNTER — Ambulatory Visit (HOSPITAL_COMMUNITY)
Admission: RE | Admit: 2013-08-04 | Discharge: 2013-08-04 | Disposition: A | Discharge status: Home / Self Care | Payer: 59 | Source: Ambulatory Visit | Attending: Oncology | Admitting: Oncology

## 2013-08-04 ENCOUNTER — Encounter (INDEPENDENT_AMBULATORY_CARE_PROVIDER_SITE_OTHER): Payer: Self-pay

## 2013-08-04 ENCOUNTER — Other Ambulatory Visit (HOSPITAL_BASED_OUTPATIENT_CLINIC_OR_DEPARTMENT_OTHER): Payer: 59 | Admitting: Lab

## 2013-08-04 DIAGNOSIS — Z853 Personal history of malignant neoplasm of breast: Secondary | ICD-10-CM | POA: Insufficient documentation

## 2013-08-04 DIAGNOSIS — C50419 Malignant neoplasm of upper-outer quadrant of unspecified female breast: Secondary | ICD-10-CM

## 2013-08-04 DIAGNOSIS — R05 Cough: Secondary | ICD-10-CM | POA: Insufficient documentation

## 2013-08-04 DIAGNOSIS — C50919 Malignant neoplasm of unspecified site of unspecified female breast: Secondary | ICD-10-CM

## 2013-08-04 DIAGNOSIS — R0989 Other specified symptoms and signs involving the circulatory and respiratory systems: Secondary | ICD-10-CM | POA: Insufficient documentation

## 2013-08-04 DIAGNOSIS — Z87891 Personal history of nicotine dependence: Secondary | ICD-10-CM | POA: Insufficient documentation

## 2013-08-04 DIAGNOSIS — R059 Cough, unspecified: Secondary | ICD-10-CM | POA: Insufficient documentation

## 2013-08-04 LAB — COMPREHENSIVE METABOLIC PANEL (CC13)
Anion Gap: 8 mEq/L (ref 3–11)
CO2: 26 mEq/L (ref 22–29)
Glucose: 109 mg/dl (ref 70–140)
Sodium: 139 mEq/L (ref 136–145)
Total Bilirubin: 0.32 mg/dL (ref 0.20–1.20)
Total Protein: 7.1 g/dL (ref 6.4–8.3)

## 2013-08-04 LAB — CBC WITH DIFFERENTIAL/PLATELET
Eosinophils Absolute: 0.2 10*3/uL (ref 0.0–0.5)
HCT: 35.9 % (ref 34.8–46.6)
LYMPH%: 47.4 % (ref 14.0–49.7)
MONO#: 0.5 10*3/uL (ref 0.1–0.9)
NEUT#: 2.9 10*3/uL (ref 1.5–6.5)
NEUT%: 41.6 % (ref 38.4–76.8)
Platelets: 257 10*3/uL (ref 145–400)
RBC: 4.08 10*6/uL (ref 3.70–5.45)
WBC: 7 10*3/uL (ref 3.9–10.3)

## 2013-08-11 ENCOUNTER — Ambulatory Visit (HOSPITAL_BASED_OUTPATIENT_CLINIC_OR_DEPARTMENT_OTHER): Payer: 59 | Admitting: Oncology

## 2013-08-11 VITALS — BP 124/70 | HR 83 | Temp 98.6°F | Resp 20 | Ht 62.75 in | Wt 163.2 lb

## 2013-08-11 DIAGNOSIS — M2550 Pain in unspecified joint: Secondary | ICD-10-CM

## 2013-08-11 DIAGNOSIS — C50919 Malignant neoplasm of unspecified site of unspecified female breast: Secondary | ICD-10-CM

## 2013-08-11 DIAGNOSIS — C50419 Malignant neoplasm of upper-outer quadrant of unspecified female breast: Secondary | ICD-10-CM

## 2013-08-11 DIAGNOSIS — M810 Age-related osteoporosis without current pathological fracture: Secondary | ICD-10-CM

## 2013-08-11 DIAGNOSIS — Z170 Estrogen receptor positive status [ER+]: Secondary | ICD-10-CM

## 2013-08-11 DIAGNOSIS — C50411 Malignant neoplasm of upper-outer quadrant of right female breast: Secondary | ICD-10-CM

## 2013-08-11 DIAGNOSIS — Z853 Personal history of malignant neoplasm of breast: Secondary | ICD-10-CM | POA: Insufficient documentation

## 2013-08-11 DIAGNOSIS — M25519 Pain in unspecified shoulder: Secondary | ICD-10-CM

## 2013-08-11 NOTE — Progress Notes (Signed)
ID: Enis Slipper   DOB: September 26, 1951  MR#: [id number]  CSN#:[id number]  PCP: Carollee Herter, MD GYN: Shirlyn Goltz NP SU:  OTHER MD:   HISTORY OF PRESENT ILLNESS: Francesa had a routine screening mammogram at [facility] on 10/13/03. She had further views with right ultrasound on November 16, 2003 and on that day, she had a biopsy which confirmed the presence of an invasive breast cancer which was ER positive, PR 25% positive and HER-2  2+.  FISH was done and it showed borderline implication by FISH with a ratio of 2.0, HER-2/CEP 17.    With this information, the patient was evaluated by Cicero Duck. The patient was fairly adamant that she wanted to have bilateral mastectomies.  Her mother had died from cancer before the age of 36 and her sister had bilateral mastectomies for DCIS.    Accordingly, this was done on February 16th and the final pathology ([id number]) showed on the right a grade 3 infiltrating ductal carcinoma with 1 of 3 sentinel lymph nodes positive. Accordingly, Dr. Jamey Ripa proceeded to full axillary lymph node dissection and there were 2 out of 14 lymph nodes involved.  The left breast was negative and no sentinel lymph nodes sampling was done there. Her subsequent history is as detailed below.  INTERVAL HISTORY: Tamie returns for followup of her breast cancer. The interval history is stable. 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 She is leading "younger guys" due to dog bathing to save her back.  REVIEW OF SYSTEMS: She walks as often as she can, frequently walking all the way to [employer] or to [employer]. Of course she also is very active physically in her jaw. She denies any unusual headaches, visual changes, nausea, vomiting, or dizziness. She did develop an upper start her infection about 2 months ago and was still coughing quite a bit long after other symptoms had resolved. The cough is better. We did obtain a chest x-ray as noted below. I was unremarkable. Aside  from the back and joint pain, currently chiefly involving the right shoulder, a detailed review of systems was otherwise entirely negative.  PAST MEDICAL HISTORY: Past Medical History  Diagnosis Date  . Cancer     BREAST  . Diverticulitis   . Osteoporosis   . Breast cancer 2006  . Mitral valve prolapse 1980's    then after further testing 2005 no evidence MVP  . Hypercholesterolemia   . Fibroids     multiple  . Menopausal symptoms   osteoarthritis, minimal Hx tobacco abuse, Hx MVP  PAST SURGICAL HISTORY: Past Surgical History  Procedure Laterality Date  . Breast surgery  2005    BILATERAL MASTECTOMY  . Colonoscopy  2008    Dr.Mann  . Cervical ectopy  04/1987  05/1988    benign  . Finger amputation Left     left fifth finger due to dog bite    FAMILY HISTORY Family History  Problem Relation Age of Onset  . Breast cancer Mother 73  . Diabetes Father   . Heart disease Father   . Hypertension Father   . Breast cancer Sister     DCIS dx in her 28s  . Hemochromatosis Brother   . Cancer Maternal Aunt     unknown type  . Cancer Maternal Uncle     sinus cavity  . Spina bifida Sister   The patient's father died at the age of 57 from "old age".  The patient's mother was diagnosed with breast cancer  at the age of 11.  It was rapidly metastatic and she succumbed within two years.   The patient has a half-sister, Darl Pikes, with spina bifida. She is now 60 and has not had a mammogram and is not planning to have one.  Another half sister, Dewayne Hatch, has DCIS and is status post bilateral mastectomy.  The patient has one full brother, Annette Stable, with a hx of hemochromatosis. Iline tested negative).  He works for [employer].  GYNECOLOGIC HISTORY: She is G1 P1.    She was 62 years old at the time, and put up the child (a daughter) for adoption.  The patient had the change of life about five 2000. 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 She was married for one year in 1978. She lives by herself, except for three snakes (a boa, a python, and an Architectural technologist snake). She also has a bearded dragon.    ADVANCED DIRECTIVES: not in place  HEALTH MAINTENANCE: History  Substance Use Topics  . Smoking status: Former Smoker -- 2.50 packs/day for 10 years    Types: Cigarettes    Quit date: 11/24/1977  . Smokeless tobacco: Never Used  . Alcohol Use: No     Colonoscopy:  PAP:  Bone density:  Lipid panel:  Allergies  Allergen Reactions  . Percocet [Oxycodone-Acetaminophen]     Current Outpatient Prescriptions  Medication Sig Dispense Refill  . alendronate (FOSAMAX) 70 MG tablet TAKE 1 TABLET BY MOUTH EVERY 7 DAYS . TAKE WITH A FULL GLASS OF WATER ON AN EMPTY STOMACH  4 tablet  11  . aspirin 81 MG tablet Take 81 mg by mouth daily.      . calcium carbonate (OS-CAL) 600 MG TABS Take 600 mg by mouth 2 (two) times daily with a meal.        . fenofibrate micronized (LOFIBRA) 134 MG capsule TAKE 1 CAPSULE BY MOUTH DAILY  30 capsule  5  . Multiple Vitamins-Minerals (MULTIVITAMIN WITH MINERALS) tablet Take 1 tablet by mouth daily.        . rosuvastatin (CRESTOR) 20 MG tablet Take 20 mg by mouth every evening.      . tamoxifen (NOLVADEX) 20 MG tablet Take 1 tablet (20 mg total) by mouth daily.  90 tablet  12  . Vitamin D, Ergocalciferol, (DRISDOL) 50000 UNITS CAPS capsule Take 1 capsule (50,000 Units total) by mouth every 7 (seven) days. On wednesday  30 capsule  2   No current facility-administered medications for this visit.    OBJECTIVE: Middle-aged white woman in no acute distress, Filed Vitals:   08/11/13 1536  BP: 124/70  Pulse: 83  Temp: 98.6 F (37 C)  Resp: 20     Body mass index is 29.13 kg/(m^2).    ECOG FS: 0  Sclerae unicteric, pupils equal round and reactive Oropharynx  Shows no thrush or other lesions No cervical or supraclavicular adenopathy Lungs no rales or rhonchi Heart regular rate and  rhythm Abd soft, nontender, positive bowel sounds MSK no focal spinal tenderness, no peripheral edema Neuro: nonfocal, well oriented, pleasant affect Breasts: She status post bilateral mastectomies. There is no evidence of local recurrence. Both axillae are benign   LAB RESULTS: Lab Results  Component Value Date   WBC 7.0 08/04/2013   NEUTROABS 2.9 08/04/2013   HGB 12.1 08/04/2013   HCT 35.9 08/04/2013   MCV 88.1 08/04/2013   PLT 257 08/04/2013  Chemistry      Component Value Date/Time   NA 139 08/04/2013 1534   NA 140 08/06/2011 1555   K 3.7 08/04/2013 1534   K 3.9 08/06/2011 1555   CL 104 08/04/2012 1623   CL 103 08/06/2011 1555   CO2 26 08/04/2013 1534   CO2 27 08/06/2011 1555   BUN 12.4 08/04/2013 1534   BUN 16 08/06/2011 1555   CREATININE 0.7 08/04/2013 1534   CREATININE 0.65 08/06/2011 1555   CREATININE 0.68 06/11/2011 1047      Component Value Date/Time   CALCIUM 8.8 08/04/2013 1534   CALCIUM 9.3 08/06/2011 1555   ALKPHOS 58 08/04/2013 1534   ALKPHOS 66 08/06/2011 1555   AST 21 08/04/2013 1534   AST 20 08/06/2011 1555   ALT 16 08/04/2013 1534   ALT 17 08/06/2011 1555   BILITOT 0.32 08/04/2013 1534   BILITOT 0.1* 08/06/2011 1555       Lab Results  Component Value Date   LABCA2 25 08/04/2012    No components found with this basename: [id number]    No results found for this basename: INR,  in the last 168 hours  Urinalysis    Component Value Date/Time   BILIRUBINUR neg 05/25/2013 1042   UROBILINOGEN negative 05/25/2013 1042   NITRITE neg 05/25/2013 1042   LEUKOCYTESUR small (1+) 05/25/2013 1042    STUDIES: Dg Chest 2 View  08/04/2013   CLINICAL DATA:  Congestion and cough for 1 month. Ex-smoker with breast cancer.  EXAM: CHEST  2 VIEW  COMPARISON:  08/04/2012  FINDINGS: Right axillary surgical clips. Suspect bilateral mastectomy. Midline trachea. Normal heart size and mediastinal contours. No pleural effusion or pneumothorax. Clear lungs.   IMPRESSION: No acute cardiopulmonary disease.   Electronically Signed   By: Jeronimo Greaves M.D.   On: 08/04/2013 16:23    ASSESSMENT: 62 y.o. BRCA negative [place] woman status post bilateral mastectomies February 2005 for a right-sided invasive ductal carcinoma, T2 N1 (stage IIB), grade 3, triple positive  (1) treated with paclitaxel and trastuzumab x12, followed by doxorubicin and cyclophosphamide x4, completed August 2005,  (2) the trastuzumab was continued until August 2006.  (3) She was on exemestane between September 2005 and October 2010, at which time she started on tamoxifen.   (4) osteoporosis, on alendronate, with an improving bone density.   PLAN: Trysta is doing terrific, now nearly 10 years out from her initial surgery. She will complete a full 10 years of antiestrogen therapy at her next visit here, which will be October of 2015, and at that point she will "graduate" from cancer followup. She knows to call for any problems that may develop before her return visit here.  Lovenia Debruler C    08/11/2013

## 2013-09-01 ENCOUNTER — Encounter: Payer: Self-pay | Admitting: Nurse Practitioner

## 2013-09-08 ENCOUNTER — Ambulatory Visit (INDEPENDENT_AMBULATORY_CARE_PROVIDER_SITE_OTHER): Payer: 59 | Admitting: Family Medicine

## 2013-09-08 ENCOUNTER — Encounter: Payer: Self-pay | Admitting: Family Medicine

## 2013-09-08 VITALS — BP 126/80 | HR 86 | Ht 63.0 in | Wt 162.0 lb

## 2013-09-08 DIAGNOSIS — Z0000 Encounter for general adult medical examination without abnormal findings: Secondary | ICD-10-CM

## 2013-09-08 DIAGNOSIS — Z2911 Encounter for prophylactic immunotherapy for respiratory syncytial virus (RSV): Secondary | ICD-10-CM

## 2013-09-08 DIAGNOSIS — T50904A Poisoning by unspecified drugs, medicaments and biological substances, undetermined, initial encounter: Secondary | ICD-10-CM

## 2013-09-08 DIAGNOSIS — C50419 Malignant neoplasm of upper-outer quadrant of unspecified female breast: Secondary | ICD-10-CM

## 2013-09-08 DIAGNOSIS — E785 Hyperlipidemia, unspecified: Secondary | ICD-10-CM

## 2013-09-08 DIAGNOSIS — C50411 Malignant neoplasm of upper-outer quadrant of right female breast: Secondary | ICD-10-CM

## 2013-09-08 DIAGNOSIS — M818 Other osteoporosis without current pathological fracture: Secondary | ICD-10-CM

## 2013-09-08 LAB — POCT URINALYSIS DIPSTICK
Bilirubin, UA: NEGATIVE
Leukocytes, UA: NEGATIVE
Nitrite, UA: NEGATIVE
Protein, UA: NEGATIVE
Urobilinogen, UA: NEGATIVE
pH, UA: 5

## 2013-09-08 LAB — LIPID PANEL: HDL: 33 mg/dL — ABNORMAL LOW (ref >39)

## 2013-09-08 NOTE — Progress Notes (Signed)
  Subjective:    Patient ID: [name], female    DOB: Jul 12, 1951, 62 y.o.   MRN: [id number]  HPI She is here for complete examination. She continues to be followed by oncology. She is about to finish taking tamoxifen. She continues on Crestor as well as Fosamax and other dietary supplements. She also takes fenofibrate. She was recently seen by her oncologist. Family and social history was reviewed. DEXA scan was done in 2013. She is up-to-date on her immunizations. She works at a [employer]. She has had a flu shot.  Review of Systems Negative except as above    Objective:   Physical Exam alert and in no distress. Tympanic membranes and canals are normal. Throat is clear. Tonsils are normal. Neck is supple without adenopathy or thyromegaly. Cardiac exam shows a regular sinus rhythm without murmurs or gallops. Lungs are clear to auscultation. Abdominal exam shows no masses or tenderness with normal bowel sounds. Some blood work was evaluated.      Assessment & Plan:  Routine general medical examination at a health care facility - Plan: POCT Urinalysis Dipstick, Varicella-zoster vaccine subcutaneous, Lipid panel  Breast cancer of upper-outer quadrant of right female breast  Hyperlipidemia LDL goal < 100 - Plan: Lipid panel  Drug-induced osteoporosis  continue on present medications.

## 2013-09-09 NOTE — Progress Notes (Signed)
Quick Note:  Mailed pt letter of lab results and diet info ______

## 2013-12-09 ENCOUNTER — Other Ambulatory Visit: Payer: Self-pay | Admitting: Family Medicine

## 2014-03-04 ENCOUNTER — Other Ambulatory Visit: Payer: Self-pay | Admitting: *Deleted

## 2014-03-04 DIAGNOSIS — C50419 Malignant neoplasm of upper-outer quadrant of unspecified female breast: Secondary | ICD-10-CM

## 2014-03-04 MED ORDER — TAMOXIFEN CITRATE 20 MG PO TABS
20.0000 mg | ORAL_TABLET | Freq: Every day | ORAL | Status: DC
Start: 2014-03-04 — End: 2015-06-06

## 2014-05-25 ENCOUNTER — Ambulatory Visit: Payer: 59 | Admitting: Nurse Practitioner

## 2014-06-02 ENCOUNTER — Encounter: Payer: Self-pay | Admitting: Nurse Practitioner

## 2014-06-02 ENCOUNTER — Ambulatory Visit (INDEPENDENT_AMBULATORY_CARE_PROVIDER_SITE_OTHER): Payer: 59 | Admitting: Nurse Practitioner

## 2014-06-02 VITALS — BP 130/89 | HR 76 | Ht 62.75 in | Wt 165.0 lb

## 2014-06-02 DIAGNOSIS — M949 Disorder of cartilage, unspecified: Secondary | ICD-10-CM

## 2014-06-02 DIAGNOSIS — M8580 Other specified disorders of bone density and structure, unspecified site: Secondary | ICD-10-CM

## 2014-06-02 DIAGNOSIS — M899 Disorder of bone, unspecified: Secondary | ICD-10-CM

## 2014-06-02 DIAGNOSIS — C50919 Malignant neoplasm of unspecified site of unspecified female breast: Secondary | ICD-10-CM

## 2014-06-02 DIAGNOSIS — Z01419 Encounter for gynecological examination (general) (routine) without abnormal findings: Secondary | ICD-10-CM

## 2014-06-02 DIAGNOSIS — C50911 Malignant neoplasm of unspecified site of right female breast: Secondary | ICD-10-CM

## 2014-06-02 DIAGNOSIS — Z0000 Encounter for general adult medical examination without abnormal findings: Secondary | ICD-10-CM

## 2014-06-02 LAB — POCT URINALYSIS DIPSTICK
Bilirubin, UA: NEGATIVE
Glucose, UA: NEGATIVE
Ketones, UA: NEGATIVE
NITRITE UA: NEGATIVE
PH UA: 6
PROTEIN UA: NEGATIVE
RBC UA: NEGATIVE
UROBILINOGEN UA: NEGATIVE

## 2014-06-02 MED ORDER — VITAMIN D (ERGOCALCIFEROL) 1.25 MG (50000 UNIT) PO CAPS: 50000.0000 [IU] | ORAL_CAPSULE | ORAL | Status: DC

## 2014-06-02 NOTE — Patient Instructions (Addendum)

## 2014-06-02 NOTE — Progress Notes (Signed)
Patient ID: [name], female   DOB: 17-Jul-1951, 63 y.o.   MRN: [id number] 63 y.o. G1P1 Single Caucasian Fe here for annual exam.  No new diagnosis since last year.  She is planning on a cruise this September.   Patient's last menstrual period was 02/20/1999.           Sexually active: no   The current method of family planning is none.     Exercising: yes  walks to work daily Smoker:  yes  Health Maintenance: Pap:  04/17/12, WNL, neg HR HPV MMG:  Mastectomy, CXR 08/04/13, normal Colonoscopy:  02/27/07, normal, repeat in 10 years   BMD:   08/14/12, low bone mass TDaP:  2010 Labs: HB: [place]       Urine:  Trace leuk's   reports that she quit smoking about 36 years ago. Her smoking use included Cigarettes. She has a 25 pack-year smoking history. She has never used smokeless tobacco. She reports that she does not drink alcohol or use illicit drugs.  Past Medical History  Diagnosis Date  . Cancer     BREAST  . Diverticulitis   . Osteoporosis   . Breast cancer 2006  . Mitral valve prolapse 1980's    then after further testing 2005 no evidence MVP  . Hypercholesterolemia   . Fibroids     multiple  . Menopausal symptoms     Past Surgical History  Procedure Laterality Date  . Breast surgery  2005    BILATERAL MASTECTOMY  . Colonoscopy  2008    Dr.Mann  . Cervical ectopy  04/1987  05/1988    benign  . Finger amputation Left     left fifth finger due to dog bite    Current Outpatient Prescriptions  Medication Sig Dispense Refill  . alendronate (FOSAMAX) 70 MG tablet TAKE 1 TABLET BY MOUTH EVERY 7 DAYS. TAKE WITH A FULL GLASS OF WATER ON AN EMPTY STOMACH  4 tablet  4  . calcium carbonate (OS-CAL) 600 MG TABS Take 600 mg by mouth 2 (two) times daily with a meal.        . CRESTOR 20 MG tablet TAKE 1 TABLET BY MOUTH DAILY  30 tablet  4  . fenofibrate micronized (LOFIBRA) 134 MG capsule TAKE 1 CAPSULE BY MOUTH DAILY  30 capsule  5  . Multiple Vitamins-Minerals  (MULTIVITAMIN WITH MINERALS) tablet Take 1 tablet by mouth daily.        . tamoxifen (NOLVADEX) 20 MG tablet Take 1 tablet (20 mg total) by mouth daily.  90 tablet  0  . Vitamin D, Ergocalciferol, (DRISDOL) 50000 UNITS CAPS capsule Take 1 capsule (50,000 Units total) by mouth every 7 (seven) days. On wednesday  30 capsule  2  . aspirin 81 MG tablet Take 81 mg by mouth daily.       No current facility-administered medications for this visit.    Family History  Problem Relation Age of Onset  . Breast cancer Mother 56  . Diabetes Father   . Heart disease Father   . Hypertension Father   . Breast cancer Sister     DCIS dx in her 22s  . Hemochromatosis Brother   . Cancer Maternal Aunt     unknown type  . Cancer Maternal Uncle     sinus cavity  . Spina bifida Sister     ROS:  Pertinent items are noted in HPI.  Otherwise, a comprehensive ROS was negative.  Exam:  BP 130/89  Pulse 76  Ht 5' 2.75" (1.594 m)  Wt 165 lb (74.844 kg)  BMI 29.46 kg/m2  LMP 02/20/1999 Height: 5' 2.75" (159.4 cm)  Ht Readings from Last 3 Encounters:  06/02/14 5' 2.75" (1.594 m)  09/08/13 5\' 3"  (1.6 m)  08/11/13 5' 2.75" (1.594 m)    General appearance: alert, cooperative and appears stated age Head: Normocephalic, without obvious abnormality, atraumatic Neck: no adenopathy, supple, symmetrical, trachea midline and thyroid normal to inspection and palpation Lungs: clear to auscultation bilaterally Breasts: bilateral mastectomies without any new lesions or tenderness. Heart: regular rate and rhythm Abdomen: soft, non-tender; no masses,  no organomegaly Extremities: extremities normal, atraumatic, no cyanosis or edema Skin: Skin color, texture, turgor normal. No rashes or lesions Lymph nodes: Cervical, supraclavicular, and axillary nodes normal. No abnormal inguinal nodes palpated Neurologic: Grossly normal   Pelvic: External genitalia:  no lesions              Urethra:  normal appearing urethra  with no masses, tenderness or lesions              Bartholin's and Skene's: normal                 Vagina: normal appearing vagina with normal color and discharge, no lesions              Cervix: anteverted              Pap taken: No. Bimanual Exam:  Uterus:  normal size, contour, position, consistency, mobility, non-tender              Adnexa: no mass, fullness, tenderness               Rectovaginal: Confirms               Anus:  normal sphincter tone, no lesions  A:  Well Woman with normal exam  S/P Bilateral Mastectomies secondary to right invasive cancer 10/2003, chemo treatment. Still on Tamoxifen  Osteopenia on Fosamax since 11/09 after trial of Boniva that was discontinued secondary to GERD.   History of hypercholesterolemia, MVP,   P:   Reviewed health and wellness pertinent to exam  Pap smear not taken today  Mammogram -not done .  CXR 2014 - normal  Will refill Vit D and she will be getting labs done at Oncology next month and will follow.  Order for BMD and if stable consider a 'Drug Holiday' from Fosamax  Counseled on osteoporosis, adequate intake of calcium and vitamin D, diet and exercise, Kegel's exercises return annually or prn  An After Visit Summary was printed and given to the patient.

## 2014-06-06 NOTE — Progress Notes (Signed)
Encounter reviewed by Dr. Ezequias Lard Silva.  

## 2014-06-19 IMAGING — CR DG CHEST 2V
2 series · 2 of 2 positions shown · non-contrast
Comparison: 08/06/2011 and prior chest radiographs

CLINICAL DATA: Chest pain.  History of breast cancer.

CHEST - 2 VIEW

[w chest pa]
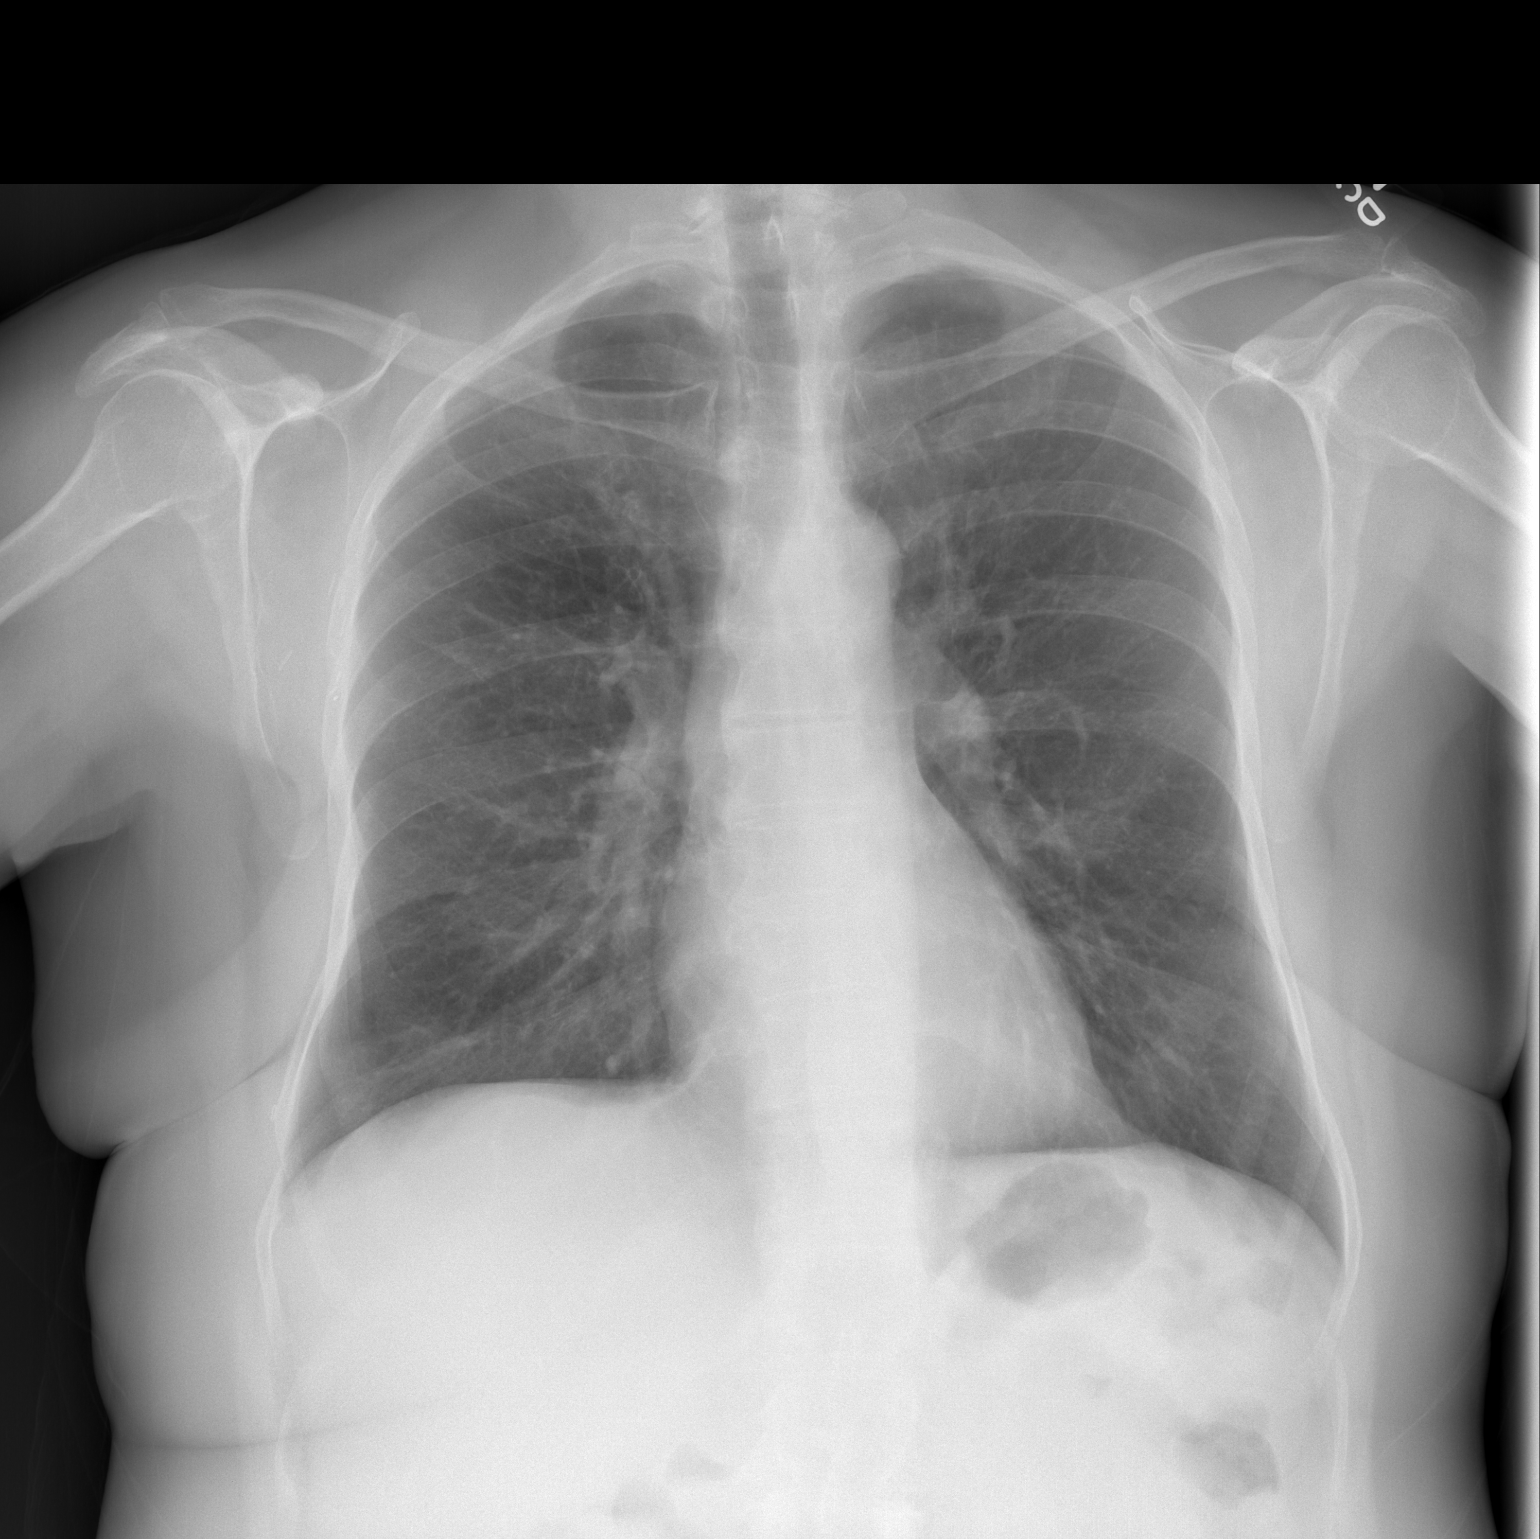

[w chest lat]
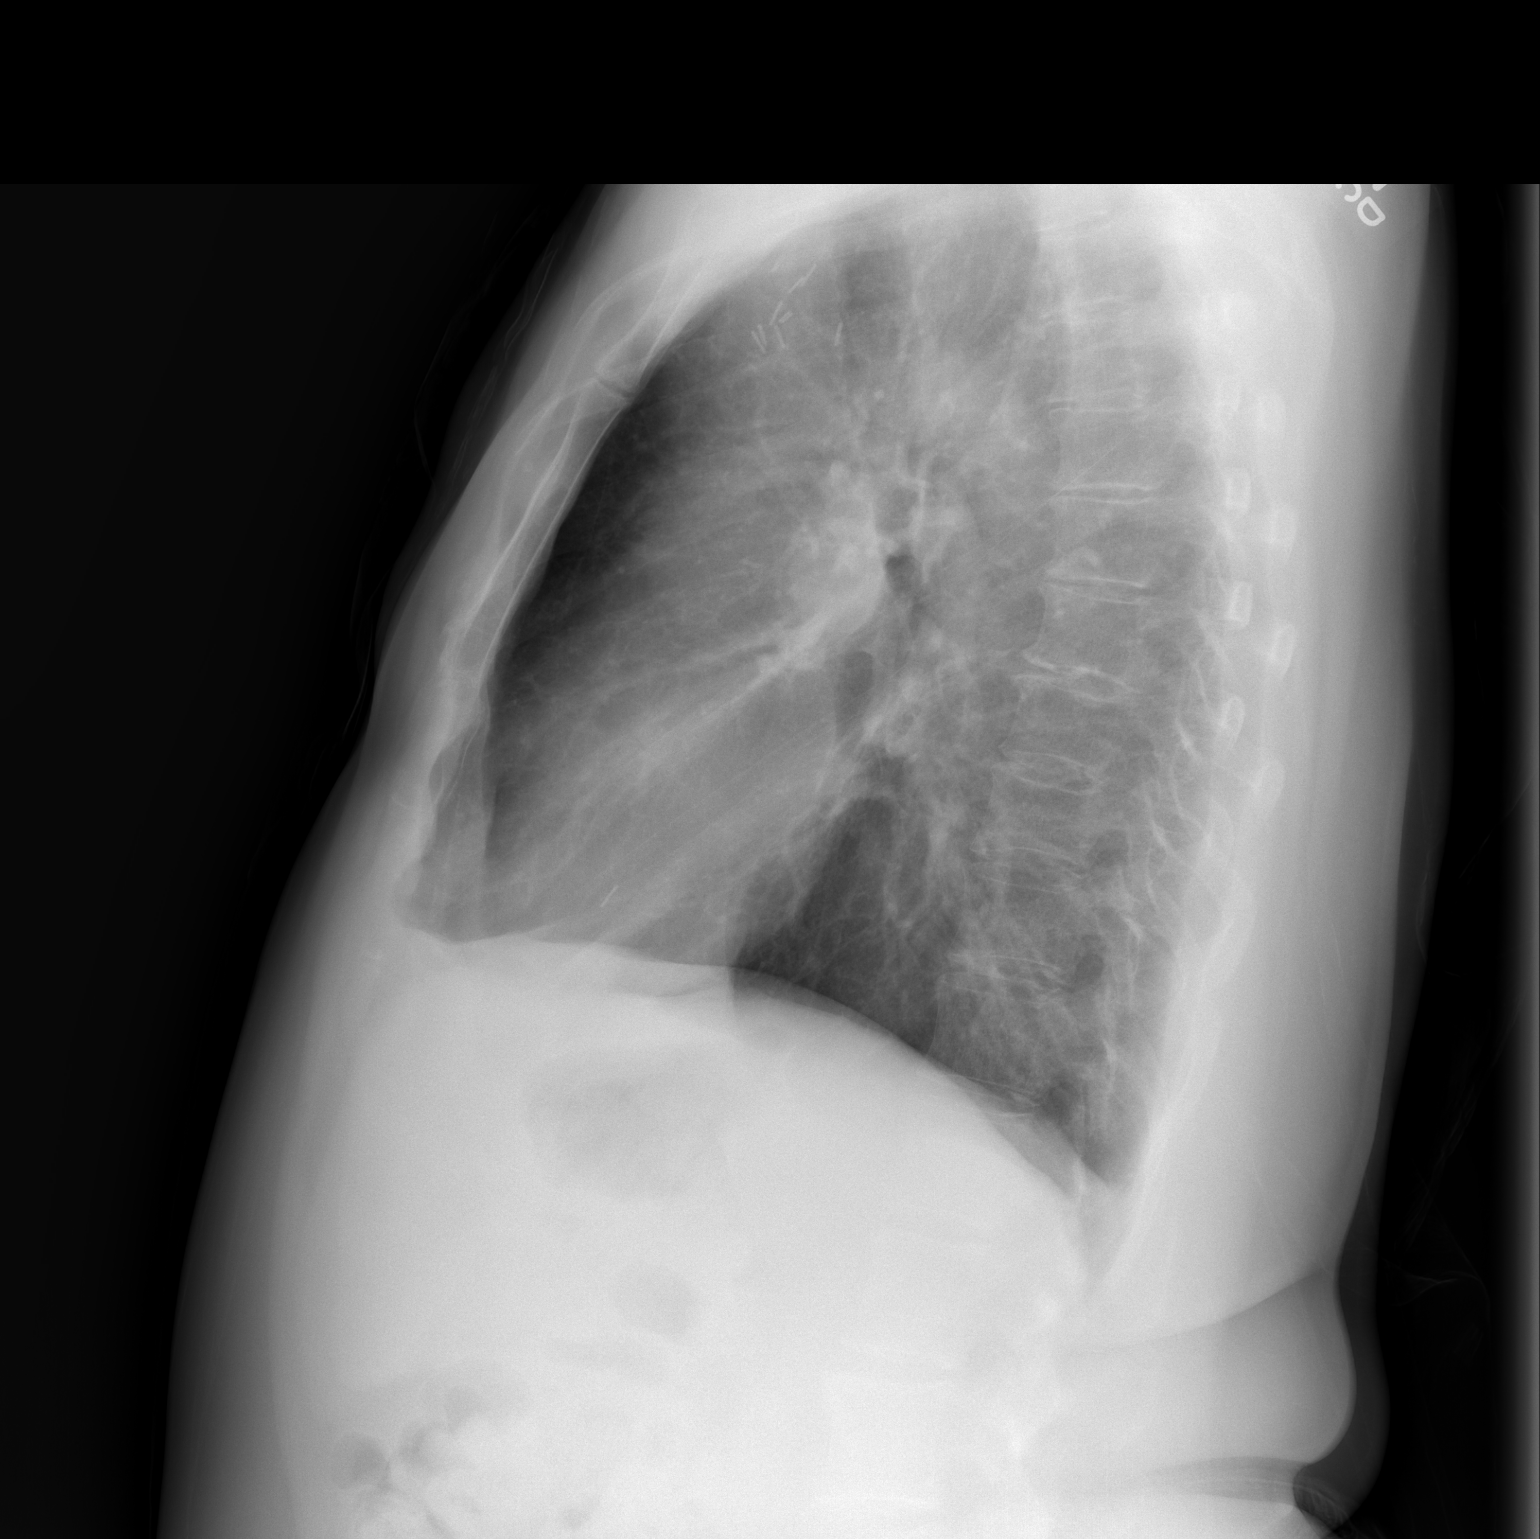

[2 of 2 positions shown; findings below may reference images not displayed]

FINDINGS: The cardiomediastinal silhouette is unremarkable.
There is no evidence of focal airspace disease, pulmonary edema,
suspicious pulmonary nodule/mass, pleural effusion, or
pneumothorax.
No acute bony abnormalities are identified.
Surgical clips in the right axilla are again noted.
IMPRESSION: No evidence of active cardiopulmonary disease.

## 2014-08-04 ENCOUNTER — Other Ambulatory Visit (HOSPITAL_BASED_OUTPATIENT_CLINIC_OR_DEPARTMENT_OTHER): Payer: 59

## 2014-08-04 ENCOUNTER — Ambulatory Visit (HOSPITAL_COMMUNITY)
Admission: RE | Admit: 2014-08-04 | Discharge: 2014-08-04 | Disposition: A | Discharge status: Home / Self Care | Payer: 59 | Source: Ambulatory Visit | Attending: Oncology | Admitting: Oncology

## 2014-08-04 DIAGNOSIS — Z87891 Personal history of nicotine dependence: Secondary | ICD-10-CM | POA: Diagnosis not present

## 2014-08-04 DIAGNOSIS — C50919 Malignant neoplasm of unspecified site of unspecified female breast: Secondary | ICD-10-CM

## 2014-08-04 DIAGNOSIS — C50411 Malignant neoplasm of upper-outer quadrant of right female breast: Secondary | ICD-10-CM

## 2014-08-04 LAB — CBC WITH DIFFERENTIAL/PLATELET
BASO%: 0.4 % (ref 0.0–2.0)
Basophils Absolute: 0 10*3/uL (ref 0.0–0.1)
EOS ABS: 0.2 10*3/uL (ref 0.0–0.5)
EOS%: 1.8 % (ref 0.0–7.0)
HCT: 39.2 % (ref 34.8–46.6)
HGB: 12.7 g/dL (ref 11.6–15.9)
LYMPH%: 38.9 % (ref 14.0–49.7)
MCH: 29.5 pg (ref 25.1–34.0)
MCHC: 32.4 g/dL (ref 31.5–36.0)
MCV: 91.2 fL (ref 79.5–101.0)
MONO#: 0.6 10*3/uL (ref 0.1–0.9)
MONO%: 6.7 % (ref 0.0–14.0)
NEUT%: 52.2 % (ref 38.4–76.8)
NEUTROS ABS: 4.7 10*3/uL (ref 1.5–6.5)
PLATELETS: 272 10*3/uL (ref 145–400)
RBC: 4.3 10*6/uL (ref 3.70–5.45)
RDW: 13.6 % (ref 11.2–14.5)
WBC: 9 10*3/uL (ref 3.9–10.3)
lymph#: 3.5 10*3/uL — ABNORMAL HIGH (ref 0.9–3.3)

## 2014-08-04 LAB — COMPREHENSIVE METABOLIC PANEL (CC13)
ALK PHOS: 63 U/L (ref 40–150)
ALT: 18 U/L (ref 0–55)
AST: 16 U/L (ref 5–34)
Albumin: 3.9 g/dL (ref 3.5–5.0)
Anion Gap: 10 mEq/L (ref 3–11)
BILIRUBIN TOTAL: 0.21 mg/dL (ref 0.20–1.20)
BUN: 21 mg/dL (ref 7.0–26.0)
CO2: 27 mEq/L (ref 22–29)
Calcium: 9.9 mg/dL (ref 8.4–10.4)
Chloride: 105 mEq/L (ref 98–109)
Creatinine: 1 mg/dL (ref 0.6–1.1)
GLUCOSE: 102 mg/dL (ref 70–140)
Potassium: 4.3 mEq/L (ref 3.5–5.1)
SODIUM: 141 meq/L (ref 136–145)
TOTAL PROTEIN: 7.1 g/dL (ref 6.4–8.3)

## 2014-08-11 ENCOUNTER — Ambulatory Visit (HOSPITAL_BASED_OUTPATIENT_CLINIC_OR_DEPARTMENT_OTHER): Payer: 59 | Admitting: Oncology

## 2014-08-11 ENCOUNTER — Other Ambulatory Visit: Payer: 59 | Admitting: Lab

## 2014-08-11 DIAGNOSIS — Z853 Personal history of malignant neoplasm of breast: Secondary | ICD-10-CM

## 2014-08-11 DIAGNOSIS — M810 Age-related osteoporosis without current pathological fracture: Secondary | ICD-10-CM

## 2014-08-11 DIAGNOSIS — C50411 Malignant neoplasm of upper-outer quadrant of right female breast: Secondary | ICD-10-CM

## 2014-08-11 NOTE — Progress Notes (Signed)
ID: Willette Pa   DOB: 01/04/1951  MR#: [id number]  RDE#:[id number]  PCP: Wyatt Haste, MD GYN: Edman Circle NP SU:  OTHER MD:   HISTORY OF PRESENT ILLNESS: From the earlier summary:  Renie had a routine screening mammogram at [facility] on 10/13/03. She had further views with right ultrasound on November 16, 2003 and on that day, she had a biopsy which confirmed the presence of an invasive breast cancer which was ER positive, PR 25% positive and HER-2  2+.  FISH was done and it showed borderline implication by FISH with a ratio of 2.0, HER-2/CEP 17.    With this information, the patient was evaluated by [facility]. The patient was fairly adamant that she wanted to have bilateral mastectomies.  Her mother had died from cancer before the age of 67 and her sister had bilateral mastectomies for DCIS.    Accordingly, this was done on February 16th and the final pathology ([id number]) showed on the right a grade 3 infiltrating ductal carcinoma with 1 of 3 sentinel lymph nodes positive. Accordingly, Dr. Margot Chimes proceeded to full axillary lymph node dissection and there were 2 out of 14 lymph nodes involved.  The left breast was negative and no sentinel lymph nodes sampling was done there. Her subsequent history is as detailed below.  INTERVAL HISTORY: Carrissa returns for followup of her breast cancer. The interval historyis unremarkable. 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  REVIEW OF SYSTEMS: She has some joint and back pains which are not new or moe intense or persistent than before, Otherwise a detailed review of systems today was benign  PAST MEDICAL HISTORY: Past Medical History  Diagnosis Date  . Cancer     BREAST  . Diverticulitis   . Osteoporosis   . Breast cancer 2006  . Mitral valve prolapse 1980's    then after further testing 2005 no  evidence MVP  . Hypercholesterolemia   . Fibroids     multiple  . Menopausal symptoms   osteoarthritis, minimal Hx tobacco abuse, Hx MVP  PAST SURGICAL HISTORY: Past Surgical History  Procedure Laterality Date  . Breast surgery  2005    BILATERAL MASTECTOMY  . Colonoscopy  2008    Dr.Mann  . Cervical ectopy  04/1987  05/1988    benign  . Finger amputation Left     left fifth finger due to dog bite    FAMILY HISTORY Family History  Problem Relation Age of Onset  . Breast cancer Mother 51  . Diabetes Father   . Heart disease Father   . Hypertension Father   . Breast cancer Sister     DCIS dx in her 72s  . Hemochromatosis Brother   . Cancer Maternal Aunt     unknown type  . Cancer Maternal Uncle     sinus cavity  . Spina bifida Sister   The patient's father died at the age of 30 from "old age".  The patient's mother was diagnosed with breast cancer at the age of 29.  It was rapidly metastatic and she succumbed within two years.   The patient has a half-sister, Manuela Schwartz, with spina bifida. She is now 40 and has not had a mammogram and is not planning to have one.  Another half sister, Lelon Frohlich, has DCIS and is status post bilateral mastectomy.  The patient has one full brother, Rush Landmark,  with a hx of hemochromatosis. Arayna tested negative).  He works for [employer].  GYNECOLOGIC HISTORY: She is G1 P1.    She was 63 years old at the time, and put up the child (a daughter) for adoption.  The patient had the change of life about five 2000. She never took hormone replacement  SOCIAL HISTORY: She works as a Training and development officer for [employer]. She was married for one year in 1978. She lives by herself, except for three snakes (a boa, a python, and an Artist snake). She also has a bearded dragon.    ADVANCED DIRECTIVES: not in place  HEALTH MAINTENANCE: History  Substance Use Topics  . Smoking status: Former Smoker -- 2.50 packs/day for 10 years    Types: Cigarettes     Quit date: 11/24/1977  . Smokeless tobacco: Never Used  . Alcohol Use: No     Colonoscopy:  PAP:  Bone density:  Lipid panel:  Allergies  Allergen Reactions  . Percocet [Oxycodone-Acetaminophen]     Current Outpatient Prescriptions  Medication Sig Dispense Refill  . alendronate (FOSAMAX) 70 MG tablet TAKE 1 TABLET BY MOUTH EVERY 7 DAYS. TAKE WITH A FULL GLASS OF WATER ON AN EMPTY STOMACH  4 tablet  4  . aspirin 81 MG tablet Take 81 mg by mouth daily.      . calcium carbonate (OS-CAL) 600 MG TABS Take 600 mg by mouth 2 (two) times daily with a meal.        . CRESTOR 20 MG tablet TAKE 1 TABLET BY MOUTH DAILY  30 tablet  4  . fenofibrate micronized (LOFIBRA) 134 MG capsule TAKE 1 CAPSULE BY MOUTH DAILY  30 capsule  5  . Multiple Vitamins-Minerals (MULTIVITAMIN WITH MINERALS) tablet Take 1 tablet by mouth daily.        . tamoxifen (NOLVADEX) 20 MG tablet Take 1 tablet (20 mg total) by mouth daily.  90 tablet  0  . Vitamin D, Ergocalciferol, (DRISDOL) 50000 UNITS CAPS capsule Take 1 capsule (50,000 Units total) by mouth every 7 (seven) days. On wednesday  30 capsule  2   No current facility-administered medications for this visit.    OBJECTIVE: Middle-aged white woman in no acute distress, Wt 164.8  T 98.7  RR 18  HR  96  BP  111/74    ECOG FS: 0  Sclerae unicteric, EOMs intact Oropharynx  clear No cervical or supraclavicular adenopathy Lungs no rales or rhonchi Heart regular rate and rhythm Abd soft, nontender, positive bowel sounds MSK no focal spinal tenderness, no upper extremity lymphedema Neuro: nonfocal, well oriented, positive affect Breasts: status post bilateral mastectomies. There is no evidence of chest wall recurrence. Both axillae are benign   LAB RESULTS: Lab Results  Component Value Date   WBC 9.0 08/04/2014   NEUTROABS 4.7 08/04/2014   HGB 12.7 08/04/2014   HCT 39.2 08/04/2014   MCV 91.2 08/04/2014   PLT 272 08/04/2014      Chemistry       Component Value Date/Time   NA 141 08/04/2014 1526   NA 140 08/06/2011 1555   K 4.3 08/04/2014 1526   K 3.9 08/06/2011 1555   CL 104 08/04/2012 1623   CL 103 08/06/2011 1555   CO2 27 08/04/2014 1526   CO2 27 08/06/2011 1555   BUN 21.0 08/04/2014 1526   BUN 16 08/06/2011 1555   CREATININE 1.0 08/04/2014 1526   CREATININE 0.65 08/06/2011 1555   CREATININE 0.68 06/11/2011  1047      Component Value Date/Time   CALCIUM 9.9 08/04/2014 1526   CALCIUM 9.3 08/06/2011 1555   ALKPHOS 63 08/04/2014 1526   ALKPHOS 66 08/06/2011 1555   AST 16 08/04/2014 1526   AST 20 08/06/2011 1555   ALT 18 08/04/2014 1526   ALT 17 08/06/2011 1555   BILITOT 0.21 08/04/2014 1526   BILITOT 0.1* 08/06/2011 1555       Lab Results  Component Value Date   LABCA2 25 08/04/2012    No components found with this basename: [id number]    No results found for this basename: INR,  in the last 168 hours  Urinalysis    Component Value Date/Time   BILIRUBINUR neg 06/02/2014 1631   PROTEINUR neg 06/02/2014 1631   UROBILINOGEN negative 06/02/2014 1631   NITRITE neg 06/02/2014 1631   LEUKOCYTESUR Trace 06/02/2014 1631    STUDIES: CLINICAL DATA: Evaluate breast cancer. Prior smoker.  EXAM:  CHEST 2 VIEW  COMPARISON: None.  FINDINGS:  Cardiomediastinal silhouette is unremarkable. The lungs are clear  without pleural effusions or focal consolidations. Trachea projects  midline and there is no pneumothorax. Soft tissue planes and  included osseous structures are non-suspicious. Surgical clips right  chest.  IMPRESSION:  No active cardiopulmonary disease.  CT of the chest with contrast versus PET-CT would be more sensitive  for evaluation of breast cancer.  Electronically Signed  By: Elon Alas  On: 08/04/2014 16:53    ASSESSMENT: 63 y.o. BRCA negative [place] woman status post bilateral mastectomies February 2005 for a right-sided invasive ductal carcinoma, T2 N1 (stage IIB), grade 3, triple  positive  (1) treated with paclitaxel and trastuzumab x12, followed by doxorubicin and cyclophosphamide x4, completed August 2005,  (2) the trastuzumab was continued until August 2006.  (3) She was on exemestane between September 2005 and October 2010, at which time she started on tamoxifen.   (4) osteoporosis, on alendronate, with an improving bone density.   PLAN: Jala is now nearly 11 years out from her definitive surgery and has completed 10 years of anti-estrogens. She can stop the tamoxifen now--no need for a taper. She is ready to "get out of the cancer business."   She will need yearly mammography and a yearly physician chest wall exam.  I will be glad to see her at any point if the need arises, but as of now we are making no routine follow-up appointment for her here.    Cacie Gaskins C    08/11/2014

## 2014-08-22 ENCOUNTER — Encounter: Payer: Self-pay | Admitting: Nurse Practitioner

## 2014-09-12 ENCOUNTER — Encounter: Payer: 59 | Admitting: Family Medicine

## 2014-10-11 ENCOUNTER — Ambulatory Visit (INDEPENDENT_AMBULATORY_CARE_PROVIDER_SITE_OTHER): Payer: 59 | Admitting: Family Medicine

## 2014-10-11 ENCOUNTER — Encounter: Payer: Self-pay | Admitting: Family Medicine

## 2014-10-11 VITALS — BP 120/72 | HR 84 | Ht 62.5 in | Wt 161.0 lb

## 2014-10-11 DIAGNOSIS — T50905A Adverse effect of unspecified drugs, medicaments and biological substances, initial encounter: Secondary | ICD-10-CM

## 2014-10-11 DIAGNOSIS — Z853 Personal history of malignant neoplasm of breast: Secondary | ICD-10-CM

## 2014-10-11 DIAGNOSIS — E785 Hyperlipidemia, unspecified: Secondary | ICD-10-CM

## 2014-10-11 DIAGNOSIS — Z0000 Encounter for general adult medical examination without abnormal findings: Secondary | ICD-10-CM

## 2014-10-11 DIAGNOSIS — M818 Other osteoporosis without current pathological fracture: Secondary | ICD-10-CM

## 2014-10-11 LAB — LIPID PANEL
CHOLESTEROL: 183 mg/dL (ref 0–200)
HDL: 33 mg/dL — ABNORMAL LOW (ref >39)
LDL CALC: 90 mg/dL (ref 0–99)
TRIGLYCERIDES: 302 mg/dL — AB (ref <150)
Total CHOL/HDL Ratio: 5.5 Ratio
VLDL: 60 mg/dL — ABNORMAL HIGH (ref 0–40)

## 2014-10-11 LAB — POCT URINALYSIS DIPSTICK
BILIRUBIN UA: NEGATIVE
Glucose, UA: NEGATIVE
Ketones, UA: NEGATIVE
Leukocytes, UA: NEGATIVE
NITRITE UA: NEGATIVE
PH UA: 6
Protein, UA: NEGATIVE
RBC UA: NEGATIVE
Urobilinogen, UA: NEGATIVE

## 2014-10-11 MED ORDER — FENOFIBRATE MICRONIZED 134 MG PO CAPS: 134.0000 mg | ORAL_CAPSULE | Freq: Every day | ORAL | Status: DC

## 2014-10-11 MED ORDER — ROSUVASTATIN CALCIUM 20 MG PO TABS: 20.0000 mg | ORAL_TABLET | Freq: Every day | ORAL | Status: DC

## 2014-10-11 NOTE — Addendum Note (Signed)
Addended by: Minette Headland A on: 10/11/2014 09:20 AM   Modules accepted: Orders

## 2014-10-11 NOTE — Progress Notes (Signed)
   Subjective:    Patient ID: [name], female    DOB: Dec 02, 1950, 63 y.o.   MRN: [id number]  HPI She is here for a complete examination. She does see her gynecologist regularly. She has a history of breast cancer diagnosed in 2005. She was on tamoxifen but is no longer on that. She gets yearly chest x-rays. She has a history of osteoporosis since 2009 and has been on Fosamax. Most recent DEXA 2 years ago did show osteopenia. She also has a history of hyperlipidemia and presently is on Crestor and fenofibrate. Her diet and exercise are unchanged. She does not smoke. Exercise is mainly through work.   Review of Systems  All other systems reviewed and are negative.      Objective:   Physical Exam BP 120/72 mmHg  Pulse 84  Ht 5' 2.5" (1.588 m)  Wt 161 lb (73.029 kg)  BMI 28.96 kg/m2  LMP 02/20/1999  General Appearance:    Alert, cooperative, no distress, appears stated age  Head:    Normocephalic, without obvious abnormality, atraumatic  Eyes:    PERRL, conjunctiva/corneas clear, EOM's intact, fundi    Poorly seen  Ears:    Normal TM's and external ear canals  Nose:   Nares normal, mucosa normal, no drainage or sinus   tenderness  Throat:   Lips, mucosa, and tongue normal; teeth and gums normal  Neck:   Supple, no lymphadenopathy;  thyroid:  no   enlargement/tenderness/nodules; no carotid   bruit or JVD  Back:    Spine nontender, no curvature, ROM normal, no CVA     tenderness  Lungs:     Clear to auscultation bilaterally without wheezes, rales or     ronchi; respirations unlabored  Chest Wall:    No tenderness or deformity   Heart:    Regular rate and rhythm, S1 and S2 normal, no murmur, rub   or gallop  Breast Exam:    Deferred to GYN  Abdomen:     Soft, non-tender, nondistended, normoactive bowel sounds,    no masses, no hepatosplenomegaly  Genitalia:    Deferred to GYN     Extremities:   No clubbing, cyanosis or edema  Pulses:   2+ and symmetric all extremities    Skin:   Skin color, texture, turgor normal, no rashes or lesions  Lymph nodes:   Cervical, supraclavicular, and axillary nodes normal  Neurologic:   CNII-XII intact, normal strength, sensation and gait; reflexes 2+ and symmetric throughout          Psych:   Normal mood, affect, hygiene and grooming.          Assessment & Plan:  Routine general medical examination at a health care facility  Drug-induced osteoporosis - Plan: DG Bone Density  Hyperlipidemia with target LDL less than 100 - Plan: Lipid panel, rosuvastatin (CRESTOR) 20 MG tablet, fenofibrate micronized (LOFIBRA) 134 MG capsule  History of breast cancer in female  I encouraged her to continue to take good care of herself.

## 2014-10-12 ENCOUNTER — Telehealth: Payer: Self-pay | Admitting: Family Medicine

## 2014-10-12 DIAGNOSIS — E785 Hyperlipidemia, unspecified: Secondary | ICD-10-CM

## 2014-10-17 NOTE — Telephone Encounter (Signed)
P.A. Fenofibrate 134mg  is denied, pt must have history of failure to fenofibrate 54mg  or 160mg  (generic Arnell Asal) Do you want to switch?

## 2014-10-17 NOTE — Telephone Encounter (Signed)
[name] I think this goes to you 

## 2014-10-17 NOTE — Telephone Encounter (Signed)
Let her know about the prior authorization and call in the fenofibrate higher dose

## 2014-10-19 MED ORDER — FENOFIBRATE 160 MG PO TABS: 160.0000 mg | ORAL_TABLET | Freq: Every day | ORAL | Status: DC

## 2014-10-19 NOTE — Telephone Encounter (Signed)
Called pharmacy & switched pt to 160 mg, went thru ins for $65 a month.  Pt informed

## 2014-11-22 ENCOUNTER — Ambulatory Visit
Admission: RE | Admit: 2014-11-22 | Discharge: 2014-11-22 | Disposition: A | Discharge status: Home / Self Care | Payer: 59 | Source: Ambulatory Visit | Attending: Family Medicine | Admitting: Family Medicine

## 2014-11-22 DIAGNOSIS — T50905A Adverse effect of unspecified drugs, medicaments and biological substances, initial encounter: Principal | ICD-10-CM

## 2014-11-22 DIAGNOSIS — M818 Other osteoporosis without current pathological fracture: Secondary | ICD-10-CM

## 2014-11-22 LAB — HM DEXA SCAN

## 2014-11-29 ENCOUNTER — Telehealth: Payer: Self-pay

## 2014-11-29 NOTE — Telephone Encounter (Signed)
She can stop the Fosamax since she's no longer on the tamoxifen. She can try any of the OTC omega-3's. There is no one in particular seems to be the best

## 2014-11-29 NOTE — Telephone Encounter (Signed)
Patient informed and verbalized understand.

## 2014-11-29 NOTE — Telephone Encounter (Signed)
Dr.lalonde I was told to call Patient to inform her she was osteopenia to stay on fosamax as long as she was on tamoxifen per [place] Patient called back and stated she was off of the tamoxifen and does she need to stay on fosamax the fish oil that you had RX her is making her sick can she take something over the counter please advise

## 2014-11-29 NOTE — Telephone Encounter (Signed)
Left message patient has osteopenia and she will need to stay on fosamax as long as she is on the cancer med tomaxafen

## 2014-12-02 ENCOUNTER — Encounter: Payer: Self-pay | Admitting: Internal Medicine

## 2014-12-27 ENCOUNTER — Other Ambulatory Visit: Payer: Self-pay | Admitting: Family Medicine

## 2015-04-04 ENCOUNTER — Telehealth: Payer: Self-pay | Admitting: Internal Medicine

## 2015-04-04 DIAGNOSIS — Z0279 Encounter for issue of other medical certificate: Secondary | ICD-10-CM

## 2015-04-04 NOTE — Telephone Encounter (Signed)
Faxed over medical records to [facility] @ [phone] today 04/04/15

## 2015-05-17 ENCOUNTER — Encounter: Payer: Self-pay | Admitting: Cardiology

## 2015-06-06 ENCOUNTER — Encounter: Payer: Self-pay | Admitting: Nurse Practitioner

## 2015-06-06 ENCOUNTER — Ambulatory Visit (INDEPENDENT_AMBULATORY_CARE_PROVIDER_SITE_OTHER): Payer: 59 | Admitting: Nurse Practitioner

## 2015-06-06 VITALS — BP 124/76 | HR 72 | Ht 62.5 in | Wt 161.0 lb

## 2015-06-06 DIAGNOSIS — Z01419 Encounter for gynecological examination (general) (routine) without abnormal findings: Secondary | ICD-10-CM | POA: Diagnosis not present

## 2015-06-06 DIAGNOSIS — Z853 Personal history of malignant neoplasm of breast: Secondary | ICD-10-CM

## 2015-06-06 DIAGNOSIS — C50911 Malignant neoplasm of unspecified site of right female breast: Secondary | ICD-10-CM

## 2015-06-06 DIAGNOSIS — E559 Vitamin D deficiency, unspecified: Secondary | ICD-10-CM | POA: Diagnosis not present

## 2015-06-06 DIAGNOSIS — Z0000 Encounter for general adult medical examination without abnormal findings: Secondary | ICD-10-CM | POA: Diagnosis not present

## 2015-06-06 LAB — POCT URINALYSIS DIPSTICK
Bilirubin, UA: NEGATIVE
Blood, UA: NEGATIVE
Glucose, UA: NEGATIVE
Ketones, UA: NEGATIVE
Nitrite, UA: NEGATIVE
PH UA: 5
PROTEIN UA: NEGATIVE
Urobilinogen, UA: NEGATIVE

## 2015-06-06 MED ORDER — VITAMIN D (ERGOCALCIFEROL) 1.25 MG (50000 UNIT) PO CAPS: 50000.0000 [IU] | ORAL_CAPSULE | ORAL | Status: DC

## 2015-06-06 NOTE — Progress Notes (Signed)
Encounter reviewed by Dr. Shataya Winkles Amundson C. Silva.  

## 2015-06-06 NOTE — Patient Instructions (Signed)

## 2015-06-06 NOTE — Progress Notes (Signed)
Patient ID: [name], female   DOB: 1951/08/03, 64 y.o.   MRN: [id number] 64 y.o. G1P1 Single  Caucasian Fe here for annual exam.  Will be planning a trip via cruise.  Went off Tamoxifen 07/2014.  Went off Fosamax in fall 2015 after 5 years  Patient's last menstrual period was 02/20/1999 (exact date).          Sexually active: No.  The current method of family planning is none.    Exercising: Yes.    walking and work  Smoker:  no  Health Maintenance: Pap:  04/17/12, WNL, neg HR HPV MMG:  Mastectomy, CXR 08/04/14, normal Colonoscopy:  02/27/07, normal, repeat in 10 years - IFOB given at PCP  BMD:  11/22/14, T-Score -2.2 S/-0.4 L Hip  stable TDaP:  2010 Shingles: age 77 Prevnar 57: not yet Labs:  HB:  PCP in EPIC     Urine:  1+ leuk's   reports that she quit smoking about 37 years ago. Her smoking use included Cigarettes. She has a 25 pack-year smoking history. She has never used smokeless tobacco. She reports that she does not drink alcohol or use illicit drugs.  Past Medical History  Diagnosis Date  . Cancer     BREAST  . Diverticulitis   . Osteoporosis   . Breast cancer 2006  . Mitral valve prolapse 1980's    then after further testing 2005 no evidence MVP  . Hypercholesterolemia   . Fibroids     multiple  . Menopausal symptoms     Past Surgical History  Procedure Laterality Date  . Breast surgery  2005    BILATERAL MASTECTOMY  . Colonoscopy  2008    Dr.Mann  . Cervical ectopy  04/1987  05/1988    benign  . Finger amputation Left     left fifth finger due to dog bite    Current Outpatient Prescriptions  Medication Sig Dispense Refill  . aspirin 81 MG tablet Take 81 mg by mouth daily.    . calcium carbonate (OS-CAL) 600 MG TABS Take 600 mg by mouth 2 (two) times daily with a meal.      . Multiple Vitamins-Minerals (MULTIVITAMIN WITH MINERALS) tablet Take 1 tablet by mouth daily.      . rosuvastatin (CRESTOR) 20 MG tablet Take 1 tablet (20 mg total) by mouth  daily. 90 tablet 3  . Vitamin D, Ergocalciferol, (DRISDOL) 50000 UNITS CAPS capsule Take 1 capsule (50,000 Units total) by mouth every 7 (seven) days. On wednesday 30 capsule 2   No current facility-administered medications for this visit.    Family History  Problem Relation Age of Onset  . Breast cancer Mother 26  . Diabetes Father   . Heart disease Father   . Hypertension Father   . Breast cancer Sister     DCIS dx in her 73s  . Hemochromatosis Brother   . Cancer Maternal Aunt     unknown type  . Cancer Maternal Uncle     sinus cavity  . Spina bifida Sister     ROS:  Pertinent items are noted in HPI.  Otherwise, a comprehensive ROS was negative.  Exam:   BP 124/76 mmHg  Pulse 72  Ht 5' 2.5" (1.588 m)  Wt 161 lb (73.029 kg)  BMI 28.96 kg/m2  LMP 02/20/1999 (Exact Date) Height: 5' 2.5" (158.8 cm) Ht Readings from Last 3 Encounters:  06/06/15 5' 2.5" (1.588 m)  10/11/14 5' 2.5" (1.588 m)  06/02/14 5' 2.75" (  1.594 m)    General appearance: alert, cooperative and appears stated age Head: Normocephalic, without obvious abnormality, atraumatic Neck: no adenopathy, supple, symmetrical, trachea midline and thyroid normal to inspection and palpation Lungs: clear to auscultation bilaterally Breasts: bilateral mastecomies without any mass or nodes Heart: regular rate and rhythm Abdomen: soft, non-tender; no masses,  no organomegaly Extremities: extremities normal, atraumatic, no cyanosis or edema Skin: Skin color, texture, turgor normal. No rashes or lesions Lymph nodes: Cervical, supraclavicular, and axillary nodes normal. No abnormal inguinal nodes palpated Neurologic: Grossly normal   Pelvic: External genitalia:  no lesions              Urethra:  normal appearing urethra with no masses, tenderness or lesions              Bartholin's and Skene's: normal                 Vagina: normal appearing vagina with normal color and discharge, no lesions              Cervix:  anteverted              Pap taken: Yes.   Bimanual Exam:  Uterus:  normal size, contour, position, consistency, mobility, non-tender              Adnexa: no mass, fullness, tenderness               Rectovaginal: Confirms               Anus:  normal sphincter tone, no lesions  Chaperone present:  yes  A:  Well Woman with normal exam  S/P Bilateral Mastectomies secondary to right invasive cancer 10/2003, chemo treatment. Off Tamoxifen 10 2015 Osteopenia on Fosamax since 11/09  - fall 2015 after trial of Boniva that was discontinued secondary to GERD.  History of hypercholesterolemia, MVP,    P:   Reviewed health and wellness pertinent to exam  Pap smear as above  Mammogram not indicated  Refill on Vit D and follow with lab  Counseled on breast self exam, adequate intake of calcium and vitamin D, diet and exercise, Kegel's exercises return annually or prn  An After Visit Summary was printed and given to the patient.

## 2015-06-07 ENCOUNTER — Telehealth: Payer: Self-pay | Admitting: *Deleted

## 2015-06-07 LAB — VITAMIN D 25 HYDROXY (VIT D DEFICIENCY, FRACTURES): Vit D, 25-Hydroxy: 22 ng/mL — ABNORMAL LOW (ref 30–100)

## 2015-06-07 NOTE — Telephone Encounter (Signed)
I have attempted to contact this patient by phone with the following results: left message to return call to [place] at [phone] on answering machine (mobile per [facility]).  Advised message was regarding Vit D results.  [phone] (Mobile)

## 2015-06-07 NOTE — Telephone Encounter (Signed)
-----   Message from Kem Boroughs, [place] sent at 06/07/2015  8:13 AM EDT ----- Please let pt. Know that Vit D level is much lower than last year.  Went from 65 to 40.  She has a new RX for Vit D - but has she been taking?

## 2015-06-07 NOTE — Telephone Encounter (Signed)
Patient says she has been taking it 1x/wk every Wednesday, she has been skipping a few times. Patient is instructed to take vitamin d 1x/wk and that her rx has been sent into the pharmacy.

## 2015-06-08 LAB — IPS PAP TEST WITH HPV

## 2015-06-09 ENCOUNTER — Encounter: Payer: Self-pay | Admitting: Nurse Practitioner

## 2015-07-21 ENCOUNTER — Encounter: Payer: 59 | Admitting: Family Medicine

## 2015-08-29 ENCOUNTER — Encounter: Payer: Self-pay | Admitting: Family Medicine

## 2015-08-29 ENCOUNTER — Ambulatory Visit (INDEPENDENT_AMBULATORY_CARE_PROVIDER_SITE_OTHER): Payer: 59 | Admitting: Family Medicine

## 2015-08-29 VITALS — BP 122/72 | HR 76 | Resp 12 | Ht 63.0 in | Wt 161.6 lb

## 2015-08-29 DIAGNOSIS — Z79899 Other long term (current) drug therapy: Secondary | ICD-10-CM | POA: Diagnosis not present

## 2015-08-29 DIAGNOSIS — M818 Other osteoporosis without current pathological fracture: Secondary | ICD-10-CM

## 2015-08-29 DIAGNOSIS — T50905A Adverse effect of unspecified drugs, medicaments and biological substances, initial encounter: Principal | ICD-10-CM

## 2015-08-29 DIAGNOSIS — Z1159 Encounter for screening for other viral diseases: Secondary | ICD-10-CM

## 2015-08-29 DIAGNOSIS — E785 Hyperlipidemia, unspecified: Secondary | ICD-10-CM

## 2015-08-29 DIAGNOSIS — Z853 Personal history of malignant neoplasm of breast: Secondary | ICD-10-CM

## 2015-08-29 LAB — CBC WITH DIFFERENTIAL/PLATELET
BASOS ABS: 0.1 10*3/uL (ref 0.0–0.1)
BASOS PCT: 1 % (ref 0–1)
Eosinophils Absolute: 0.2 10*3/uL (ref 0.0–0.7)
Eosinophils Relative: 3 % (ref 0–5)
HEMATOCRIT: 42.4 % (ref 36.0–46.0)
HEMOGLOBIN: 13.9 g/dL (ref 12.0–15.0)
LYMPHS PCT: 43 % (ref 12–46)
Lymphs Abs: 2.9 10*3/uL (ref 0.7–4.0)
MCH: 29.2 pg (ref 26.0–34.0)
MCHC: 32.8 g/dL (ref 30.0–36.0)
MCV: 89.1 fL (ref 78.0–100.0)
MONO ABS: 0.7 10*3/uL (ref 0.1–1.0)
MPV: 9.9 fL (ref 8.6–12.4)
Monocytes Relative: 10 % (ref 3–12)
NEUTROS ABS: 2.9 10*3/uL (ref 1.7–7.7)
NEUTROS PCT: 43 % (ref 43–77)
Platelets: 295 10*3/uL (ref 150–400)
RBC: 4.76 MIL/uL (ref 3.87–5.11)
RDW: 14.1 % (ref 11.5–15.5)
WBC: 6.7 10*3/uL (ref 4.0–10.5)

## 2015-08-29 LAB — LIPID PANEL
CHOL/HDL RATIO: 5.2 ratio — AB (ref <=5.0)
Cholesterol: 187 mg/dL (ref 125–200)
HDL: 36 mg/dL — AB (ref >=46)
LDL CALC: 95 mg/dL (ref <130)
Triglycerides: 280 mg/dL — ABNORMAL HIGH (ref <150)
VLDL: 56 mg/dL — ABNORMAL HIGH (ref <30)

## 2015-08-29 LAB — COMPREHENSIVE METABOLIC PANEL
ALT: 14 U/L (ref 6–29)
AST: 16 U/L (ref 10–35)
Albumin: 4.5 g/dL (ref 3.6–5.1)
Alkaline Phosphatase: 68 U/L (ref 33–130)
BILIRUBIN TOTAL: 0.7 mg/dL (ref 0.2–1.2)
BUN: 16 mg/dL (ref 7–25)
CALCIUM: 9.7 mg/dL (ref 8.6–10.4)
CO2: 27 mmol/L (ref 20–31)
Chloride: 102 mmol/L (ref 98–110)
Creat: 0.54 mg/dL (ref 0.50–0.99)
GLUCOSE: 97 mg/dL (ref 65–99)
Potassium: 4.5 mmol/L (ref 3.5–5.3)
SODIUM: 141 mmol/L (ref 135–146)
TOTAL PROTEIN: 7.2 g/dL (ref 6.1–8.1)

## 2015-08-29 NOTE — Progress Notes (Signed)
   Subjective:    Patient ID: [name], female    DOB: Sep 05, 1951, 64 y.o.   MRN: [id number]  HPI She is here for an interval evaluation. She does have a history of breast cancer and has had both breasts removed. She had drug-induced osteo-porosis and was treated with a bisphosphonate for 5 years. Presently she is on multivitamin and calcium. She does have hyperlipidemia and presently is on Crestor. She is having no symptoms from the Crestor. Social and family history as well as health maintenance and immunizations were reviewed. She is up-to-date on her shots including rabies. Work is going fairly well. She will probably not retire until she is 70. She does volunteer and will probably do more of that. She has had no chest pain, shortness of breath, abdominal issues.   Review of Systems  All other systems reviewed and are negative.      Objective:   Physical Exam Alert and in no distress. Tympanic membranes and canals are normal. Pharyngeal area is normal. Neck is supple without adenopathy or thyromegaly. Cardiac exam shows a regular sinus rhythm without murmurs or gallops. Lungs are clear to auscultation. Abdominal exam shows normal bowel sounds without masses or tenderness        Assessment & Plan:  Drug-induced osteoporosis  History of breast cancer in female - Plan: CBC with Differential/Platelet, Comprehensive metabolic panel  Hyperlipidemia with target LDL less than 100 - Plan: Lipid panel  Need for hepatitis C screening test - Plan: Hepatitis C Antibody  Encounter for long-term (current) use of medications - Plan: CBC with Differential/Platelet, Comprehensive metabolic panel, Lipid panel  I encouraged her to remain physically active.

## 2015-08-30 ENCOUNTER — Telehealth: Payer: Self-pay | Admitting: Family Medicine

## 2015-08-30 LAB — HEPATITIS C ANTIBODY: HCV AB: NEGATIVE

## 2015-08-30 NOTE — Telephone Encounter (Signed)
Pt called and stated that she had received a letter from her ins company stated that they would no longer pay for name brand Crestor. She states that she was not even aware she was on name brand. Please change pt's Crestor to generic. Pt uses [facility] on [address]

## 2015-08-31 ENCOUNTER — Other Ambulatory Visit: Payer: Self-pay

## 2015-08-31 NOTE — Telephone Encounter (Signed)
She is not getting name brand

## 2016-01-25 ENCOUNTER — Other Ambulatory Visit: Payer: Self-pay | Admitting: Family Medicine

## 2016-06-07 ENCOUNTER — Encounter: Payer: Self-pay | Admitting: Nurse Practitioner

## 2016-06-07 ENCOUNTER — Ambulatory Visit (INDEPENDENT_AMBULATORY_CARE_PROVIDER_SITE_OTHER): Payer: 59 | Admitting: Nurse Practitioner

## 2016-06-07 VITALS — BP 124/82 | HR 72 | Ht 62.25 in | Wt 167.0 lb

## 2016-06-07 DIAGNOSIS — Z0000 Encounter for general adult medical examination without abnormal findings: Secondary | ICD-10-CM | POA: Diagnosis not present

## 2016-06-07 DIAGNOSIS — Z01419 Encounter for gynecological examination (general) (routine) without abnormal findings: Secondary | ICD-10-CM | POA: Diagnosis not present

## 2016-06-07 DIAGNOSIS — R194 Change in bowel habit: Secondary | ICD-10-CM | POA: Diagnosis not present

## 2016-06-07 DIAGNOSIS — E559 Vitamin D deficiency, unspecified: Secondary | ICD-10-CM | POA: Diagnosis not present

## 2016-06-07 DIAGNOSIS — C50411 Malignant neoplasm of upper-outer quadrant of right female breast: Secondary | ICD-10-CM

## 2016-06-07 DIAGNOSIS — R198 Other specified symptoms and signs involving the digestive system and abdomen: Secondary | ICD-10-CM

## 2016-06-07 LAB — POCT URINALYSIS DIPSTICK
Bilirubin, UA: NEGATIVE
Blood, UA: NEGATIVE
Glucose, UA: NEGATIVE
KETONES UA: NEGATIVE
Leukocytes, UA: NEGATIVE
Nitrite, UA: NEGATIVE
PH UA: 6
PROTEIN UA: NEGATIVE
Urobilinogen, UA: NEGATIVE

## 2016-06-07 MED ORDER — VITAMIN D (ERGOCALCIFEROL) 1.25 MG (50000 UNIT) PO CAPS: 50000.0000 [IU] | ORAL_CAPSULE | ORAL | 3 refills | Status: DC

## 2016-06-07 NOTE — Patient Instructions (Signed)

## 2016-06-07 NOTE — Progress Notes (Signed)
Reviewed personally.  M. Suzanne Jalynn Betzold, MD.  

## 2016-06-07 NOTE — Progress Notes (Signed)
Patient ID: [name], female   DOB: [birth year], 65 y.o.   MRN: [id number]  64 y.o. G1P0001 Single  Caucasian Fe here for annual exam.  Some increase in loose to watery diarrhea.  She is unsure if this is related to diet.  Seems to be intermittent.  She does not take a probiotic but will try this.  Will also go back and see Dr. Collene Mares.  Patient's last menstrual period was 02/20/1999 (exact date).          Sexually active: No.  The current method of family planning is post menopausal status.    Exercising: Yes.    walking Smoker:  no  Health Maintenance: Pap:  06/06/15, Negative with neg HR HPV MMG:  Mastectomy, CXR 08/04/14, normal Colonoscopy:  02/27/07, normal, repeat in 10 years   BMD:  11/22/14, T-Score -2.2 Spine / -0.4 Left Femur Neck TDaP:  2010 Hep C: 08/29/15 HIV: done today Labs: Dr. Redmond School takes care of screening labs, we follow Vit D  Urine: negative   reports that she quit smoking about 38 years ago. Her smoking use included Cigarettes. She has a 25.00 pack-year smoking history. She has never used smokeless tobacco. She reports that she does not drink alcohol or use drugs.  Past Medical History:  Diagnosis Date  . Breast cancer ([place]) 2006  . Diverticulitis   . Fibroids    multiple  . Hypercholesterolemia   . Menopausal symptoms   . Mitral valve prolapse 1980's   then after further testing 2005 no evidence MVP  . Osteoporosis     Past Surgical History:  Procedure Laterality Date  . BREAST SURGERY  2005   BILATERAL MASTECTOMY  . cervical ectopy  04/1987  05/1988   benign  . COLONOSCOPY  2008   Dr.Mann  . FINGER AMPUTATION Left    left fifth finger due to dog bite    Current Outpatient Prescriptions  Medication Sig Dispense Refill  . aspirin 81 MG tablet Take 81 mg by mouth daily.    . calcium carbonate (OS-CAL) 600 MG TABS Take 600 mg by mouth 2 (two) times daily with a meal.      . CRESTOR 20 MG tablet TAKE 1 TABLET BY MOUTH DAILY 90 tablet 0  . Multiple  Vitamins-Minerals (MULTIVITAMIN WITH MINERALS) tablet Take 1 tablet by mouth daily.      . Vitamin D, Ergocalciferol, (DRISDOL) 50000 UNITS CAPS capsule Take 1 capsule (50,000 Units total) by mouth every 7 (seven) days. On wednesday 30 capsule 2   No current facility-administered medications for this visit.     Family History  Problem Relation Age of Onset  . Breast cancer Mother 9  . Diabetes Father   . Heart disease Father   . Hypertension Father   . Breast cancer Sister     DCIS dx in her 49s  . Hemochromatosis Brother   . Cancer Maternal Aunt     unknown type  . Cancer Maternal Uncle     sinus cavity  . Spina bifida Sister     ROS:  Pertinent items are noted in HPI.  Otherwise, a comprehensive ROS was negative.  Exam:   BP 124/82 (BP Location: Left Arm, Patient Position: Sitting, Cuff Size: Normal)   Pulse 72   Ht 5' 2.25" (1.581 m)   Wt 167 lb (75.8 kg)   LMP 02/20/1999 (Exact Date)   BMI 30.30 kg/m  Height: 5' 2.25" (158.1 cm) Ht Readings from Last 3 Encounters:  06/07/16 5' 2.25" (1.581 m)  08/29/15 5\' 3"  (1.6 m)  06/06/15 5' 2.5" (1.588 m)    General appearance: alert, cooperative and appears stated age Head: Normocephalic, without obvious abnormality, atraumatic Neck: no adenopathy, supple, symmetrical, trachea midline and thyroid normal to inspection and palpation Lungs: clear to auscultation bilaterally Breasts: bilateral mastectomies without new mass. Heart: regular rate and rhythm Abdomen: soft, non-tender; no masses,  no organomegaly Extremities: extremities normal, atraumatic, no cyanosis or edema Skin: Skin color, texture, turgor normal. No rashes or lesions Lymph nodes: Cervical, supraclavicular, and axillary nodes normal. No abnormal inguinal nodes palpated Neurologic: Grossly normal   Pelvic: External genitalia:  no lesions              Urethra:  normal appearing urethra with no masses, tenderness or lesions              Bartholin's and  Skene's: normal                 Vagina: normal appearing vagina with normal color and discharge, no lesions              Cervix: anteverted              Pap taken: No. Bimanual Exam:  Uterus:  normal size, contour, position, consistency, mobility, non-tender              Adnexa: no mass, fullness, tenderness               Rectovaginal: Confirms               Anus:  normal sphincter tone, no lesions  Chaperone present: yes  A:  Well Woman with normal exam              S/P Bilateral Mastectomies secondary to right invasive cancer Upper outer quadrant 10/2003, chemo treatment. Off Tamoxifen 10 2015 Osteopenia on Fosamax since 11/09  - fall 2015 after trial of Boniva that was discontinued secondary to GERD.   History of hypercholesterolemia, MVP,  P:   Reviewed health and wellness pertinent to exam  Pap smear as above  CXR is ordered as per Dr. Virgie Dad recommendation for her care  Follow with labs  Referral back to Dr. Collene Mares for changes is bowel movements.  Counseled on breast self exam, adequate intake of calcium and vitamin D, diet and exercise, Kegel's exercises return annually or prn  An After Visit Summary was printed and given to the patient.

## 2016-06-08 LAB — HIV ANTIBODY (ROUTINE TESTING W REFLEX): HIV: NONREACTIVE

## 2016-06-08 LAB — VITAMIN D 25 HYDROXY (VIT D DEFICIENCY, FRACTURES): Vit D, 25-Hydroxy: 38 ng/mL (ref 30–100)

## 2016-06-11 ENCOUNTER — Telehealth: Payer: Self-pay | Admitting: Nurse Practitioner

## 2016-08-07 NOTE — Telephone Encounter (Signed)
Erroneous encounter

## 2016-08-13 ENCOUNTER — Other Ambulatory Visit: Payer: Self-pay | Admitting: Family Medicine

## 2016-08-29 ENCOUNTER — Ambulatory Visit (INDEPENDENT_AMBULATORY_CARE_PROVIDER_SITE_OTHER): Payer: 59 | Admitting: Family Medicine

## 2016-08-29 ENCOUNTER — Encounter: Payer: Self-pay | Admitting: Family Medicine

## 2016-08-29 VITALS — BP 112/70 | HR 78 | Ht 62.0 in | Wt 167.0 lb

## 2016-08-29 DIAGNOSIS — Z853 Personal history of malignant neoplasm of breast: Secondary | ICD-10-CM

## 2016-08-29 DIAGNOSIS — Z79899 Other long term (current) drug therapy: Secondary | ICD-10-CM

## 2016-08-29 DIAGNOSIS — T50905A Adverse effect of unspecified drugs, medicaments and biological substances, initial encounter: Principal | ICD-10-CM

## 2016-08-29 DIAGNOSIS — E785 Hyperlipidemia, unspecified: Secondary | ICD-10-CM

## 2016-08-29 DIAGNOSIS — M818 Other osteoporosis without current pathological fracture: Secondary | ICD-10-CM

## 2016-08-29 LAB — CBC WITH DIFFERENTIAL/PLATELET
Basophils Absolute: 0 cells/uL (ref 0–200)
Basophils Relative: 0 %
EOS PCT: 2 %
Eosinophils Absolute: 138 cells/uL (ref 15–500)
HCT: 40.4 % (ref 35.0–45.0)
HEMOGLOBIN: 13.5 g/dL (ref 11.7–15.5)
LYMPHS ABS: 2691 {cells}/uL (ref 850–3900)
Lymphocytes Relative: 39 %
MCH: 29.5 pg (ref 27.0–33.0)
MCHC: 33.4 g/dL (ref 32.0–36.0)
MCV: 88.4 fL (ref 80.0–100.0)
MPV: 9.6 fL (ref 7.5–12.5)
Monocytes Absolute: 621 cells/uL (ref 200–950)
Monocytes Relative: 9 %
NEUTROS ABS: 3450 {cells}/uL (ref 1500–7800)
Neutrophils Relative %: 50 %
Platelets: 265 10*3/uL (ref 140–400)
RBC: 4.57 MIL/uL (ref 3.80–5.10)
RDW: 14.2 % (ref 11.0–15.0)
WBC: 6.9 10*3/uL (ref 4.0–10.5)

## 2016-08-29 NOTE — Progress Notes (Signed)
   Subjective:    Patient ID: [name], female    DOB: 01-Aug-1951, 65 y.o.   MRN: [id number]  HPI She is here for a medication management visit. She is followed regularly by gynecology. She does have underlying history of breast cancer diagnosed in 2005. She was placed on tamoxifen and also given Fosamax for several years. Follow-up DEXA scans shown her to be in the osteopenic range with a score of -2.1. She continues on multivitamins. She also takes Crestor for hyperlipidemia and is having no difficulty with that. Does not smoke and rarely drinks. She continues to work at [employer] and does plan to continue working. Family and social history as well as health maintenance and immunizations were reviewed. She does plan to get a follow-up chest x-ray soon.   Review of Systems     Objective:   Physical Exam Alert and in no distress. Tympanic membranes and canals are normal. Pharyngeal area is normal. Neck is supple without adenopathy or thyromegaly. Cardiac exam shows a regular sinus rhythm without murmurs or gallops. Lungs are clear to auscultation.        Assessment & Plan:  Drug-induced osteoporosis  History of breast cancer in female - Plan: CBC with Differential/Platelet, Comprehensive metabolic panel  Hyperlipidemia with target LDL less than 100 - Plan: Lipid panel  Encounter for long-term (current) use of medications - Plan: CBC with Differential/Platelet, Comprehensive metabolic panel, Lipid panel Encouraged her to continue to take good care of herself.

## 2016-08-30 LAB — LIPID PANEL
CHOLESTEROL: 202 mg/dL — AB (ref <200)
HDL: 36 mg/dL — ABNORMAL LOW (ref >50)
TRIGLYCERIDES: 402 mg/dL — AB (ref <150)
Total CHOL/HDL Ratio: 5.6 Ratio — ABNORMAL HIGH (ref <5.0)

## 2016-08-30 LAB — COMPREHENSIVE METABOLIC PANEL
ALBUMIN: 4.4 g/dL (ref 3.6–5.1)
ALT: 15 U/L (ref 6–29)
AST: 16 U/L (ref 10–35)
Alkaline Phosphatase: 64 U/L (ref 33–130)
BILIRUBIN TOTAL: 0.7 mg/dL (ref 0.2–1.2)
BUN: 16 mg/dL (ref 7–25)
CO2: 20 mmol/L (ref 20–31)
CREATININE: 0.66 mg/dL (ref 0.50–0.99)
Calcium: 9.2 mg/dL (ref 8.6–10.4)
Chloride: 104 mmol/L (ref 98–110)
Glucose, Bld: 101 mg/dL — ABNORMAL HIGH (ref 65–99)
Potassium: 4.1 mmol/L (ref 3.5–5.3)
SODIUM: 138 mmol/L (ref 135–146)
TOTAL PROTEIN: 7.4 g/dL (ref 6.1–8.1)

## 2016-10-10 ENCOUNTER — Encounter: Payer: Self-pay | Admitting: Family Medicine

## 2017-01-04 ENCOUNTER — Other Ambulatory Visit: Payer: Self-pay | Admitting: Family Medicine

## 2017-02-07 ENCOUNTER — Ambulatory Visit (INDEPENDENT_AMBULATORY_CARE_PROVIDER_SITE_OTHER): Payer: 59 | Admitting: Family Medicine

## 2017-02-07 ENCOUNTER — Encounter: Payer: Self-pay | Admitting: Family Medicine

## 2017-02-07 VITALS — BP 124/82 | HR 88 | Ht 62.0 in | Wt 168.0 lb

## 2017-02-07 DIAGNOSIS — Z79899 Other long term (current) drug therapy: Secondary | ICD-10-CM | POA: Diagnosis not present

## 2017-02-07 DIAGNOSIS — E785 Hyperlipidemia, unspecified: Secondary | ICD-10-CM | POA: Diagnosis not present

## 2017-02-07 DIAGNOSIS — T50905A Adverse effect of unspecified drugs, medicaments and biological substances, initial encounter: Secondary | ICD-10-CM | POA: Diagnosis not present

## 2017-02-07 DIAGNOSIS — M818 Other osteoporosis without current pathological fracture: Secondary | ICD-10-CM | POA: Diagnosis not present

## 2017-02-07 DIAGNOSIS — Z853 Personal history of malignant neoplasm of breast: Secondary | ICD-10-CM | POA: Diagnosis not present

## 2017-02-07 DIAGNOSIS — E669 Obesity, unspecified: Secondary | ICD-10-CM | POA: Diagnosis not present

## 2017-02-07 DIAGNOSIS — Z23 Encounter for immunization: Secondary | ICD-10-CM

## 2017-02-07 DIAGNOSIS — Z1211 Encounter for screening for malignant neoplasm of colon: Secondary | ICD-10-CM | POA: Diagnosis not present

## 2017-02-07 LAB — COMPREHENSIVE METABOLIC PANEL
ALBUMIN: 4.5 g/dL (ref 3.6–5.1)
ALT: 14 U/L (ref 6–29)
AST: 16 U/L (ref 10–35)
Alkaline Phosphatase: 64 U/L (ref 33–130)
BILIRUBIN TOTAL: 0.7 mg/dL (ref 0.2–1.2)
BUN: 16 mg/dL (ref 7–25)
CO2: 24 mmol/L (ref 20–31)
CREATININE: 0.68 mg/dL (ref 0.50–0.99)
Calcium: 9.4 mg/dL (ref 8.6–10.4)
Chloride: 103 mmol/L (ref 98–110)
Glucose, Bld: 102 mg/dL — ABNORMAL HIGH (ref 65–99)
Potassium: 4.2 mmol/L (ref 3.5–5.3)
SODIUM: 139 mmol/L (ref 135–146)
Total Protein: 7.2 g/dL (ref 6.1–8.1)

## 2017-02-07 LAB — CBC WITH DIFFERENTIAL/PLATELET
BASOS ABS: 0 {cells}/uL (ref 0–200)
BASOS PCT: 0 %
Eosinophils Absolute: 140 cells/uL (ref 15–500)
Eosinophils Relative: 2 %
HEMATOCRIT: 41.3 % (ref 35.0–45.0)
Hemoglobin: 13.6 g/dL (ref 11.7–15.5)
LYMPHS PCT: 45 %
Lymphs Abs: 3150 cells/uL (ref 850–3900)
MCH: 29.4 pg (ref 27.0–33.0)
MCHC: 32.9 g/dL (ref 32.0–36.0)
MCV: 89.2 fL (ref 80.0–100.0)
MONO ABS: 490 {cells}/uL (ref 200–950)
MPV: 9.9 fL (ref 7.5–12.5)
Monocytes Relative: 7 %
NEUTROS PCT: 46 %
Neutro Abs: 3220 cells/uL (ref 1500–7800)
PLATELETS: 288 10*3/uL (ref 140–400)
RBC: 4.63 MIL/uL (ref 3.80–5.10)
RDW: 14.3 % (ref 11.0–15.0)
WBC: 7 10*3/uL (ref 4.0–10.5)

## 2017-02-07 LAB — LIPID PANEL
CHOL/HDL RATIO: 5.2 ratio — AB (ref <5.0)
CHOLESTEROL: 182 mg/dL (ref <200)
HDL: 35 mg/dL — AB (ref >50)
LDL Cholesterol: 97 mg/dL (ref <100)
TRIGLYCERIDES: 251 mg/dL — AB (ref <150)
VLDL: 50 mg/dL — ABNORMAL HIGH (ref <30)

## 2017-02-07 NOTE — Progress Notes (Signed)
   Subjective:    Patient ID: [name], female    DOB: 08/22/1951, 66 y.o.   MRN: [id number]  HPI She is here for an interval evaluation. She has a history of breast cancer diagnosed in 2005. She was on Femara for approximately 10 years. She was also taking a bisphosphonate for an extended period of time. Her last DEXA scan was a proximally 2 years ago. She continues on Crestor and is having no difficulty with that. She is also taking calcium as well as a vitamin D supplement. Her weight has gone up slightly. Last colonoscopy was in 2008. She has no other concerns or complaints.   Review of Systems     Objective:   Physical Exam Alert and in no distress. Tympanic membranes and canals are normal. Pharyngeal area is normal. Neck is supple without adenopathy or thyromegaly. Cardiac exam shows a regular sinus rhythm without murmurs or gallops. Lungs are clear to auscultation. Chest wall exam shows no lesions.        Assessment & Plan:  Hyperlipidemia with target LDL less than 100 - Plan: Lipid panel  Drug-induced osteoporosis - Plan: DG Bone Density  History of breast cancer in female - Plan: Comprehensive metabolic panel, CBC with Differential/Platelet, Lipid panel  Encounter for long-term (current) use of medications - Plan: Comprehensive metabolic panel, CBC with Differential/Platelet, Lipid panel  Obesity without serious comorbidity, unspecified classification, unspecified obesity type - Plan: Comprehensive metabolic panel, CBC with Differential/Platelet, Lipid panel  Screening for colon cancer - Plan: Cologuard  Need for shingles vaccine - Plan: Varicella-zoster vaccine IM (Shingrix) Overall she seems to be doing well on her present medication regimen For the above diagnoses.. We'll follow-up on her lipids. Briefly discussed the weight gain with her. At this point I will not push the issue.

## 2017-02-24 DIAGNOSIS — Z1212 Encounter for screening for malignant neoplasm of rectum: Secondary | ICD-10-CM | POA: Diagnosis not present

## 2017-02-24 DIAGNOSIS — Z1211 Encounter for screening for malignant neoplasm of colon: Secondary | ICD-10-CM | POA: Diagnosis not present

## 2017-02-24 LAB — COLOGUARD: Cologuard: NEGATIVE

## 2017-03-03 ENCOUNTER — Encounter: Payer: Self-pay | Admitting: Family Medicine

## 2017-03-03 NOTE — Progress Notes (Signed)
LM on VCM- cologuard negative

## 2017-04-03 DIAGNOSIS — R194 Change in bowel habit: Secondary | ICD-10-CM | POA: Diagnosis not present

## 2017-04-03 DIAGNOSIS — Z1211 Encounter for screening for malignant neoplasm of colon: Secondary | ICD-10-CM | POA: Diagnosis not present

## 2017-04-03 DIAGNOSIS — K5730 Diverticulosis of large intestine without perforation or abscess without bleeding: Secondary | ICD-10-CM | POA: Diagnosis not present

## 2017-04-29 ENCOUNTER — Telehealth: Payer: Self-pay | Admitting: Obstetrics & Gynecology

## 2017-04-29 NOTE — Telephone Encounter (Signed)
LMTCB for canceled appt with PG. Patient is in annual recall letter sent.

## 2017-06-11 ENCOUNTER — Ambulatory Visit: Payer: 59 | Admitting: Nurse Practitioner

## 2017-07-15 ENCOUNTER — Ambulatory Visit (INDEPENDENT_AMBULATORY_CARE_PROVIDER_SITE_OTHER): Payer: 59 | Admitting: Obstetrics and Gynecology

## 2017-07-15 VITALS — BP 124/70 | HR 86 | Resp 16 | Ht 62.5 in | Wt 161.0 lb

## 2017-07-15 DIAGNOSIS — M8580 Other specified disorders of bone density and structure, unspecified site: Secondary | ICD-10-CM

## 2017-07-15 DIAGNOSIS — Z780 Asymptomatic menopausal state: Secondary | ICD-10-CM

## 2017-07-15 DIAGNOSIS — E559 Vitamin D deficiency, unspecified: Secondary | ICD-10-CM

## 2017-07-15 DIAGNOSIS — Z01419 Encounter for gynecological examination (general) (routine) without abnormal findings: Secondary | ICD-10-CM

## 2017-07-15 NOTE — Patient Instructions (Signed)

## 2017-07-15 NOTE — Progress Notes (Signed)
66 y.o. [name] Single Caucasian female here for annual exam.    No vaginal bleeding or spotting.   Asking for refill of vit D 50,000 IU weekly.  Taking probiotics to help with bowel function.   Works at [employer] at the [employer].   PCP:   [name]   Patient's last menstrual period was 02/20/1999 (exact date).           Sexually active: No.  The current method of family planning is post menopausal status.    Exercising: Yes.    The patient has a physically strenuous job, but has no regular exercise apart from work.  Smoker:  no  Health Maintenance: Pap:  06/06/15, Negative with neg HR HPV  04/17/12 Neg. HR HPV:neg History of abnormal Pap:  no MMG:  Mastectomy, CXR 08/04/14, normal Colonoscopy:  02/27/07, normal, repeat in 10 years.  Did Cologuard and this was negative.  BMD:   11/22/14, T-Score -2.2 Spine / -0.4 Left Femur Neck TDaP:  2010 HIV: 06/07/16 Negative Hep C: 08/29/15 Negative Screening Labs: Vit D   reports that she quit smoking about 39 years ago. Her smoking use included Cigarettes. She has a 25.00 pack-year smoking history. She has never used smokeless tobacco. She reports that she does not drink alcohol or use drugs.  Past Medical History:  Diagnosis Date  . Breast cancer ([place]) 2006  . Diverticulitis   . Fibroids    multiple  . Hypercholesterolemia   . Menopausal symptoms   . Mitral valve prolapse 1980's   then after further testing 2005 no evidence MVP  . Osteoporosis     Past Surgical History:  Procedure Laterality Date  . BREAST SURGERY  2005    Bilateral Mastectomy for right breast cancer upper outer quadrant  . cervical ectopy  04/1987  05/1988   benign  . COLONOSCOPY  2008   Dr.Mann  . FINGER AMPUTATION Left    left fifth finger due to dog bite    Current Outpatient Prescriptions  Medication Sig Dispense Refill  . aspirin 81 MG tablet Take 81 mg by mouth daily.    . calcium carbonate (OS-CAL)  600 MG TABS Take 600 mg by mouth 2 (two) times daily with a meal.      . CRESTOR 20 MG tablet TAKE 1 TABLET BY MOUTH DAILY 90 tablet 0  . Multiple Vitamins-Minerals (MULTIVITAMIN WITH MINERALS) tablet Take 1 tablet by mouth daily.      . Vitamin D, Ergocalciferol, (DRISDOL) 50000 units CAPS capsule Take 1 capsule (50,000 Units total) by mouth every 7 (seven) days. On wednesday 30 capsule 3   No current facility-administered medications for this visit.     Family History  Problem Relation Age of Onset  . Breast cancer Mother 90  . Diabetes Father   . Heart disease Father   . Hypertension Father   . Breast cancer Sister        DCIS dx in her 38s  . Hemochromatosis Brother   . Cancer Maternal Aunt        unknown type  . Cancer Maternal Uncle        sinus cavity  . Spina bifida Sister     ROS:  Pertinent items are noted in HPI.  Otherwise, a comprehensive ROS was negative.  Exam:   BP 124/70 (BP Location: Right Arm, Patient Position: Sitting, Cuff Size: Normal)   Pulse 86   Resp 16   Ht 5' 2.5" (  1.588 m)   Wt 161 lb (73 kg)   LMP 02/20/1999 (Exact Date)   BMI 28.98 kg/m     General appearance: alert, cooperative and appears stated age Head: Normocephalic, without obvious abnormality, atraumatic Neck: no adenopathy, supple, symmetrical, trachea midline and thyroid normal to inspection and palpation Lungs: clear to auscultation bilaterally Breasts:  Absent.  No axillary or supraclavicular adenopathy Heart: regular rate and rhythm Abdomen: central obesity, soft, non-tender; no masses, no organomegaly Extremities: extremities normal, atraumatic, no cyanosis or edema Skin: Skin color, texture, turgor normal. No rashes or lesions Lymph nodes: Cervical, supraclavicular, and axillary nodes normal. No abnormal inguinal nodes palpated Neurologic: Grossly normal  Pelvic: External genitalia:  no lesions              Urethra:  normal appearing urethra with no masses, tenderness or  lesions              Bartholins and Skenes: normal                 Vagina: normal appearing vagina with normal color and discharge, no lesions              Cervix: no lesions              Pap taken: No. Bimanual Exam:  Uterus:  normal size, contour, position, consistency, mobility, non-tender              Adnexa: no mass, fullness, tenderness              Rectal exam: Yes.  .  Confirms.              Anus:  normal sphincter tone, no lesions  Chaperone was present for exam.  Assessment:   Well woman visit with normal exam. Status post bilateral mastectomy for right breast cancer.  Osteopenia.  Off bisphosphonates.  Hx low vit D.  Plan: Mammogram screening discussed. Recommended self breast awareness. Pap and HR HPV as above. Guidelines for Calcium, Vitamin D, regular exercise program including cardiovascular and weight bearing exercise. Check vit D.  BMD ordered.  Follow up annually and prn.     After visit summary provided.

## 2017-07-16 ENCOUNTER — Telehealth: Payer: Self-pay | Admitting: *Deleted

## 2017-07-16 ENCOUNTER — Encounter: Payer: Self-pay | Admitting: Obstetrics and Gynecology

## 2017-07-16 ENCOUNTER — Other Ambulatory Visit: Payer: Self-pay | Admitting: Obstetrics and Gynecology

## 2017-07-16 LAB — VITAMIN D 25 HYDROXY (VIT D DEFICIENCY, FRACTURES): VIT D 25 HYDROXY: 27.4 ng/mL — AB (ref 30.0–100.0)

## 2017-07-16 MED ORDER — VITAMIN D (ERGOCALCIFEROL) 1.25 MG (50000 UNIT) PO CAPS: 50000.0000 [IU] | ORAL_CAPSULE | ORAL | 0 refills | Status: DC

## 2017-07-16 NOTE — Telephone Encounter (Signed)
Notes recorded by Burnice Logan, RN on 07/16/2017 at 9:36 AM EDT Left message to call Sharee Pimple at [phone]. See telephone encounter dated 07/16/17.  ------  Notes recorded by Nunzio Cobbs, MD on 07/16/2017 at 8:23 AM EDT Please report vit D level to patient.  I am recommending she take the Vit D 50,000 IU twice weekly for 3 months.  Please sent to pharmacy of choice and schedule a vit D recheck in 3 months.  I will place a future order.  Hopefully transition her to oral vit D in 3 months.

## 2017-07-16 NOTE — Telephone Encounter (Signed)
Spoke with patient, advised as seen below per Dr. Quincy Simmonds. Rx for Vitamin D 50,000 IU  to verified pharmacy on file. Scheduled for lab appt on 10/10/17 at 3pm. Patient verbalizes understanding and is agreeable.   Patient is agreeable to disposition. Will close encounter.

## 2017-08-12 ENCOUNTER — Ambulatory Visit (INDEPENDENT_AMBULATORY_CARE_PROVIDER_SITE_OTHER): Payer: 59 | Admitting: Family Medicine

## 2017-08-12 ENCOUNTER — Encounter: Payer: Self-pay | Admitting: Family Medicine

## 2017-08-12 VITALS — BP 130/70 | HR 99 | Ht 62.6 in | Wt 162.0 lb

## 2017-08-12 DIAGNOSIS — E785 Hyperlipidemia, unspecified: Secondary | ICD-10-CM

## 2017-08-12 DIAGNOSIS — M8580 Other specified disorders of bone density and structure, unspecified site: Secondary | ICD-10-CM | POA: Diagnosis not present

## 2017-08-12 DIAGNOSIS — Z79899 Other long term (current) drug therapy: Secondary | ICD-10-CM

## 2017-08-12 DIAGNOSIS — Z853 Personal history of malignant neoplasm of breast: Secondary | ICD-10-CM

## 2017-08-12 DIAGNOSIS — Z23 Encounter for immunization: Secondary | ICD-10-CM | POA: Diagnosis not present

## 2017-08-12 DIAGNOSIS — E669 Obesity, unspecified: Secondary | ICD-10-CM

## 2017-08-12 NOTE — Progress Notes (Signed)
   Subjective:    Patient ID: [name], female    DOB: 24-Jun-1951, 66 y.o.   MRN: [id number]  HPI She is here for an interval evaluation. She does see her gynecologist regularly. The gynecologist has her on vitamin D supplementation. She states her vitamin D level was 37. She does have a previous history of osteopenia. She continues on Crestor and is having no aches or pains with that. She does get regular follow-up concerning her breast cancer. She has had a Cologuard done. Also will need follow-up on her was vaccination. She will probably go on Medicare within the next year or 2. Smoking and drinking were reviewed.   Review of Systems     Objective:   Physical Exam Alert and in no distress. Tympanic membranes and canals are normal. Pharyngeal area is normal. Neck is supple without adenopathy or thyromegaly. Cardiac exam shows a regular sinus rhythm without murmurs or gallops. Lungs are clear to auscultation.        Assessment & Plan:  History of breast cancer in female  Hyperlipidemia with target LDL less than 100  Encounter for long-term (current) use of medications  Obesity without serious comorbidity, unspecified classification, unspecified obesity type - Plan: CBC with Differential/Platelet, Comprehensive metabolic panel, Lipid panel  Need for shingles vaccine - Plan: Varicella-zoster vaccine IM (Shingrix)  Osteopenia, unspecified location - Plan: VITAMIN D 25 Hydroxy (Vit-D Deficiency, Fractures)  Need for influenza vaccination - Plan: Flu Vaccine QUAD 6+ mos PF IM (Fluarix Quad PF)  I discussed the treatment with vitamin D. She does have a DEXA scan scheduled. Her immunizations were updated. I placed the future order for a vitamin D level. Discussed gynecologic follow-up versus coming here for her routine medical care.

## 2017-08-15 LAB — CBC WITH DIFFERENTIAL/PLATELET
BASOS ABS: 38 {cells}/uL (ref 0–200)
Basophils Relative: 0.3 %
EOS ABS: 25 {cells}/uL (ref 15–500)
Eosinophils Relative: 0.2 %
HEMATOCRIT: 41.3 % (ref 35.0–45.0)
Hemoglobin: 14.1 g/dL (ref 11.7–15.5)
LYMPHS ABS: 2558 {cells}/uL (ref 850–3900)
MCH: 29.6 pg (ref 27.0–33.0)
MCHC: 34.1 g/dL (ref 32.0–36.0)
MCV: 86.8 fL (ref 80.0–100.0)
MPV: 10.1 fL (ref 7.5–12.5)
Monocytes Relative: 6.8 %
NEUTROS ABS: 9122 {cells}/uL — AB (ref 1500–7800)
Neutrophils Relative %: 72.4 %
Platelets: 269 10*3/uL (ref 140–400)
RBC: 4.76 10*6/uL (ref 3.80–5.10)
RDW: 13.4 % (ref 11.0–15.0)
Total Lymphocyte: 20.3 %
WBC: 12.6 10*3/uL — ABNORMAL HIGH (ref 3.8–10.8)
WBCMIX: 857 {cells}/uL (ref 200–950)

## 2017-08-15 LAB — COMPREHENSIVE METABOLIC PANEL
AG RATIO: 1.5 (calc) (ref 1.0–2.5)
ALKALINE PHOSPHATASE (APISO): 70 U/L (ref 33–130)
ALT: 21 U/L (ref 6–29)
AST: 20 U/L (ref 10–35)
Albumin: 4.3 g/dL (ref 3.6–5.1)
BUN: 11 mg/dL (ref 7–25)
CHLORIDE: 99 mmol/L (ref 98–110)
CO2: 26 mmol/L (ref 20–32)
CREATININE: 0.61 mg/dL (ref 0.50–0.99)
Calcium: 9.1 mg/dL (ref 8.6–10.4)
GLOBULIN: 2.9 g/dL (ref 1.9–3.7)
Glucose, Bld: 105 mg/dL — ABNORMAL HIGH (ref 65–99)
Potassium: 4 mmol/L (ref 3.5–5.3)
Sodium: 136 mmol/L (ref 135–146)
Total Bilirubin: 0.9 mg/dL (ref 0.2–1.2)
Total Protein: 7.2 g/dL (ref 6.1–8.1)

## 2017-08-15 LAB — TEST AUTHORIZATION

## 2017-08-15 LAB — LIPID PANEL
CHOLESTEROL: 225 mg/dL — AB (ref <200)
HDL: 40 mg/dL — AB (ref >50)
Non-HDL Cholesterol (Calc): 185 mg/dL (calc) — ABNORMAL HIGH (ref <130)
Total CHOL/HDL Ratio: 5.6 (calc) — ABNORMAL HIGH (ref <5.0)
Triglycerides: 436 mg/dL — ABNORMAL HIGH (ref <150)

## 2017-08-15 LAB — HEMOGLOBIN A1C W/OUT EAG: Hgb A1c MFr Bld: 5.8 % of total Hgb — ABNORMAL HIGH (ref <5.7)

## 2017-08-18 ENCOUNTER — Ambulatory Visit (INDEPENDENT_AMBULATORY_CARE_PROVIDER_SITE_OTHER): Payer: 59 | Admitting: Family Medicine

## 2017-08-18 VITALS — BP 128/78 | HR 71 | Wt 161.2 lb

## 2017-08-18 DIAGNOSIS — E739 Lactose intolerance, unspecified: Secondary | ICD-10-CM | POA: Diagnosis not present

## 2017-08-18 DIAGNOSIS — E7439 Other disorders of intestinal carbohydrate absorption: Secondary | ICD-10-CM | POA: Diagnosis not present

## 2017-08-18 NOTE — Patient Instructions (Signed)
Look at the [employer] or [place] at your diet in terms of carbohydrate specifically white food. Bread, rice, pasta, potatoes, sugar.

## 2017-08-18 NOTE — Progress Notes (Signed)
   Subjective:    Patient ID: [name], female    DOB: June 15, 1951, 66 y.o.   MRN: [id number]  HPI She is here for a recheck. Recent blood work did show him a globin A1c of 5.8 and also elevated triglycerides. She states that her eating habits have been unchanged. She continues on Crestor 20 mg. She also states that she does have a milk intolerance causing bloating and gas.   Review of Systems     Objective:   Physical Exam Alert and in no distress otherwise not examined       Assessment & Plan:  Lactose intolerance  Glucose intolerance I discussed the diagnosis of lactose intolerance and recommend avoiding milk and milk products or use Lactaid. She apparently has tried that in the past without much success. Then discussed her glucose intolerance. Discussed diet and exercise. Recommended the [employer] or the [place]. She has started making some changes consistent with that. Will also have her use omega-3 and potentially recheck this in the next several months. Discussed the risk of becoming diabetic due to this.

## 2017-08-22 ENCOUNTER — Other Ambulatory Visit: Payer: Self-pay | Admitting: Family Medicine

## 2017-08-26 ENCOUNTER — Ambulatory Visit
Admission: RE | Admit: 2017-08-26 | Discharge: 2017-08-26 | Disposition: A | Discharge status: Home / Self Care | Payer: 59 | Source: Ambulatory Visit | Attending: Obstetrics and Gynecology | Admitting: Obstetrics and Gynecology

## 2017-08-26 DIAGNOSIS — M85832 Other specified disorders of bone density and structure, left forearm: Secondary | ICD-10-CM | POA: Diagnosis not present

## 2017-08-26 DIAGNOSIS — Z780 Asymptomatic menopausal state: Secondary | ICD-10-CM

## 2017-08-26 DIAGNOSIS — M8580 Other specified disorders of bone density and structure, unspecified site: Secondary | ICD-10-CM

## 2017-08-28 ENCOUNTER — Telehealth: Payer: Self-pay | Admitting: Obstetrics and Gynecology

## 2017-08-28 NOTE — Telephone Encounter (Signed)
Patient did not wish to reschedule. Patient to have done at her PCP.

## 2017-08-28 NOTE — Telephone Encounter (Signed)
Left patient a message to call back to reschedule a future appointment to check her vitamin D that was cancelled due to our office closing early for the holidays.

## 2017-08-28 NOTE — Telephone Encounter (Signed)
Thanks for the update.  Encounter closed by me.

## 2017-09-03 ENCOUNTER — Telehealth: Payer: Self-pay | Admitting: *Deleted

## 2017-09-03 NOTE — Telephone Encounter (Signed)
Dr. Quincy Simmonds -see message below and advise?   Notes recorded by Burnice Logan, RN on 09/03/2017 at 11:39 AM EST Spoke with Dexa Cipriano Mile, at [facility]. Was advised patients last BMD was 11/22/14 at [facility], can't compare BMD from different location. 09/01/17 was compared to last BMD at [facility] on 08/14/12. RN request to confirm date noted in assessment notes comparing BMD to 11/22/14, was advised this was compared to 08/14/12. Abby advised an update report reflecting changes in date will be faxed to office. See telephone encounter dated 09/03/17 to review with provider. ------  Notes recorded by Nunzio Cobbs, MD on 09/03/2017 at 6:06 AM EST Please contact the patient and let her know that her BMD is showing osteopenia of the forearm and normal bone density of the hip. They did not calculate her spine BMD.   I will need you to call the imaging center and have them review this BMD and addend as needed. It states that her lumbar BMD was not calculated this year because it was not done with the last BMD.  Her last BMD from 2016 is in Epic and the spine BMD was calculated.   We can contact her back after the BMD is revisited by the radiologist.

## 2017-09-08 NOTE — Telephone Encounter (Signed)
See result noted dated 09/08/17.

## 2017-09-09 NOTE — Telephone Encounter (Signed)
Spoke with patient, advised as seen below per Dr. Quincy Simmonds. Patient states she has only been to [facility] for BMD. Advised patient will f/u with [facility] and return call. Patient is agreeable.

## 2017-09-09 NOTE — Telephone Encounter (Signed)
Notes recorded by Burnice Logan, RN on 09/09/2017 at 9:31 AM EST Left message to call Sharee Pimple at [phone]. See telephone encounter dated 09/03/17.   ------  Notes recorded by Nunzio Cobbs, MD on 09/08/2017 at 8:03 PM EST Please inform the patient that her BMD is still in review as she has had this type of testing done at more than one location.  Her preliminary report shows osteopenia of the forearm and a normal hip bone density.  I am awaiting for an amended report.

## 2017-09-09 NOTE — Telephone Encounter (Signed)
Report placed on Dr. Elza Rafter desk for review.

## 2017-09-09 NOTE — Telephone Encounter (Signed)
Please report final BMD report showing osteopenia of the left forearm and a normal left hip.  Her spine was not measured as there are degenerative changes noted in the bone.  Her fracture risk is still considered to be low.  She was to follow up with her PCP regarding vit D monitoring.  I recommend calcium 1200 mg daily in divided dosages.  Weight bearing exercise is also beneficial.  Her next BMD will need to be done in 2 years.

## 2017-09-09 NOTE — Telephone Encounter (Signed)
Spoke with Abby at [facility] to f/u with amended report. Was advised will fax to [phone].

## 2017-09-10 NOTE — Telephone Encounter (Signed)
Spoke with patient, advised as seen below per Dr. Quincy Simmonds. Patient verbalizes understanding and is agreeable. Will close encounter.

## 2017-09-10 NOTE — Telephone Encounter (Signed)
Left message to call Nathania Waldman at [phone].  

## 2017-10-10 ENCOUNTER — Ambulatory Visit: Payer: 59 | Admitting: Family Medicine

## 2017-10-10 ENCOUNTER — Encounter: Payer: Self-pay | Admitting: Family Medicine

## 2017-10-10 ENCOUNTER — Other Ambulatory Visit: Payer: Self-pay

## 2017-10-10 VITALS — BP 112/70 | HR 74 | Resp 16 | Wt 159.0 lb

## 2017-10-10 DIAGNOSIS — E785 Hyperlipidemia, unspecified: Secondary | ICD-10-CM

## 2017-10-10 DIAGNOSIS — E559 Vitamin D deficiency, unspecified: Secondary | ICD-10-CM | POA: Diagnosis not present

## 2017-10-10 NOTE — Progress Notes (Signed)
   Subjective:    Patient ID: [name], female    DOB: 06/15/1951, 66 y.o.   MRN: [id number]  HPI She is here for a follow-up visit.  She has been taking 50,000 units of vitamin D twice a week for the last several months.  She was placed on this by her gynecologist.  She is here for follow-up on that.  She also has added omega-3 to her regimen.  She has made some other dietary changes.   Review of Systems     Objective:   Physical Exam Alert and in no distress otherwise not examined       Assessment & Plan:  Hyperlipidemia with target LDL less than 100 - Plan: Lipid panel  Vitamin D deficiency - Plan: VITAMIN D 25 Hydroxy (Vit-D Deficiency, Fractures) Follow-up after blood work is back.

## 2017-10-11 LAB — LIPID PANEL
Cholesterol: 208 mg/dL — ABNORMAL HIGH (ref <200)
HDL: 37 mg/dL — ABNORMAL LOW (ref >50)
LDL CHOLESTEROL (CALC): 124 mg/dL — AB
Non-HDL Cholesterol (Calc): 171 mg/dL (calc) — ABNORMAL HIGH (ref <130)
TRIGLYCERIDES: 326 mg/dL — AB (ref <150)
Total CHOL/HDL Ratio: 5.6 (calc) — ABNORMAL HIGH (ref <5.0)

## 2017-10-11 LAB — VITAMIN D 25 HYDROXY (VIT D DEFICIENCY, FRACTURES): Vit D, 25-Hydroxy: 46 ng/mL (ref 30–100)

## 2017-10-15 ENCOUNTER — Telehealth: Payer: Self-pay

## 2017-10-15 NOTE — Telephone Encounter (Signed)
Patient returned phone call regarding lab results, I gave MD note; patient had no question at this time.   Wanted you aware that this result was taken care of.  Thanks!

## 2017-12-31 DIAGNOSIS — H43813 Vitreous degeneration, bilateral: Secondary | ICD-10-CM | POA: Diagnosis not present

## 2018-07-20 ENCOUNTER — Ambulatory Visit: Payer: 59 | Admitting: Obstetrics and Gynecology

## 2018-08-16 DIAGNOSIS — Z23 Encounter for immunization: Secondary | ICD-10-CM | POA: Diagnosis not present

## 2018-08-31 ENCOUNTER — Encounter: Payer: 59 | Admitting: Family Medicine

## 2018-09-01 ENCOUNTER — Encounter: Payer: Self-pay | Admitting: Family Medicine

## 2018-09-01 ENCOUNTER — Ambulatory Visit (INDEPENDENT_AMBULATORY_CARE_PROVIDER_SITE_OTHER): Payer: 59 | Admitting: Family Medicine

## 2018-09-01 VITALS — BP 132/86 | HR 68 | Temp 98.0°F | Ht 62.0 in | Wt 162.0 lb

## 2018-09-01 DIAGNOSIS — E785 Hyperlipidemia, unspecified: Secondary | ICD-10-CM | POA: Diagnosis not present

## 2018-09-01 DIAGNOSIS — Z79899 Other long term (current) drug therapy: Secondary | ICD-10-CM

## 2018-09-01 DIAGNOSIS — M8580 Other specified disorders of bone density and structure, unspecified site: Secondary | ICD-10-CM | POA: Insufficient documentation

## 2018-09-01 DIAGNOSIS — Z23 Encounter for immunization: Secondary | ICD-10-CM | POA: Diagnosis not present

## 2018-09-01 DIAGNOSIS — Z853 Personal history of malignant neoplasm of breast: Secondary | ICD-10-CM

## 2018-09-01 DIAGNOSIS — E7439 Other disorders of intestinal carbohydrate absorption: Secondary | ICD-10-CM | POA: Insufficient documentation

## 2018-09-01 DIAGNOSIS — E559 Vitamin D deficiency, unspecified: Secondary | ICD-10-CM

## 2018-09-01 NOTE — Progress Notes (Signed)
Subjective:    Patient ID: [name], female    DOB: [birth year], 67 y.o.   MRN: [id number]  HPI She is here for complete examination.  She does have a previous history of breast cancer and has had both breasts removed.  She now does have evidence of osteopenia and presently is on vitamin D and calcium.  She does have a history of vitamin D deficiency.  Also has a history of glucose intolerance and tries to stay physically active.  She continues on Crestor for her lipids.  She does not smoke or drink.  Her work is going well.  Family and social history as well as health maintenance and immunizations was reviewed   Review of Systems  All other systems reviewed and are negative.      Objective:   Physical Exam BP 132/86 (BP Location: Left Arm, Patient Position: Sitting)   Pulse 68   Temp 98 F (36.7 C)   Ht 5\' 2"  (1.575 m)   Wt 162 lb (73.5 kg)   LMP 02/20/1999 (Exact Date)   SpO2 97%   BMI 29.63 kg/m   General Appearance:    Alert, cooperative, no distress, appears stated age  Head:    Normocephalic, without obvious abnormality, atraumatic  Eyes:    PERRL, conjunctiva/corneas clear, EOM's intact, fundi    benign  Ears:    Normal TM's and external ear canals  Nose:   Nares normal, mucosa normal, no drainage or sinus   tenderness  Throat:   Lips, mucosa, and tongue normal; teeth and gums normal  Neck:   Supple, no lymphadenopathy;  thyroid:  no   enlargement/tenderness/nodules; no carotid   bruit or JVD  Back:    Spine nontender, no curvature, ROM normal, no CVA     tenderness  Lungs:     Clear to auscultation bilaterally without wheezes, rales or     ronchi; respirations unlabored  Chest Wall:    No tenderness or deformity   Heart:    Regular rate and rhythm, S1 and S2 normal, no murmur, rub   or gallop  Breast Exam:    Deferred to GYN  Abdomen:     Soft, non-tender, nondistended, normoactive bowel sounds,    no masses, no hepatosplenomegaly  Genitalia:    Deferred  to GYN     Extremities:   No clubbing, cyanosis or edema  Pulses:   2+ and symmetric all extremities  Skin:   Skin color, texture, turgor normal, no rashes or lesions  Lymph nodes:   Cervical, supraclavicular, and axillary nodes normal  Neurologic:   CNII-XII intact, normal strength, sensation and gait; reflexes 2+ and symmetric throughout          Psych:   Normal mood, affect, hygiene and grooming.         Assessment & Plan:  Hyperlipidemia with target LDL less than 100 - Plan: Lipid panel  Vitamin D deficiency - Plan: VITAMIN D 25 Hydroxy (Vit-D Deficiency, Fractures)  Glucose intolerance - Plan: CBC with Differential/Platelet, Comprehensive metabolic panel  History of breast cancer in female  Encounter for long-term (current) use of medications - Plan: CBC with Differential/Platelet, Comprehensive metabolic panel, Lipid panel  Osteopenia, unspecified location - Plan: CBC with Differential/Platelet, Comprehensive metabolic panel, Lipid panel, VITAMIN D 25 Hydroxy (Vit-D Deficiency, Fractures)  Need for vaccination against Streptococcus pneumoniae - Plan: Pneumococcal conjugate vaccine 13-valent  Need for Tdap vaccination - Plan: Tdap vaccine greater than or equal to 7yo IM  Her immunizations were reviewed.  Again encouraged her to make diet and exercise changes specifically cutting back on carbohydrates.  Did recommend she get her last Pap.

## 2018-09-02 LAB — CBC WITH DIFFERENTIAL/PLATELET
Basophils Absolute: 0 10*3/uL (ref 0.0–0.2)
Basos: 1 %
EOS (ABSOLUTE): 0.1 10*3/uL (ref 0.0–0.4)
EOS: 1 %
HEMATOCRIT: 38.9 % (ref 34.0–46.6)
Hemoglobin: 13.4 g/dL (ref 11.1–15.9)
IMMATURE GRANULOCYTES: 0 %
Immature Grans (Abs): 0 10*3/uL (ref 0.0–0.1)
Lymphocytes Absolute: 2.4 10*3/uL (ref 0.7–3.1)
Lymphs: 38 %
MCH: 30.1 pg (ref 26.6–33.0)
MCHC: 34.4 g/dL (ref 31.5–35.7)
MCV: 87 fL (ref 79–97)
MONOS ABS: 0.5 10*3/uL (ref 0.1–0.9)
Monocytes: 8 %
Neutrophils Absolute: 3.3 10*3/uL (ref 1.4–7.0)
Neutrophils: 52 %
PLATELETS: 312 10*3/uL (ref 150–450)
RBC: 4.45 x10E6/uL (ref 3.77–5.28)
RDW: 13.1 % (ref 12.3–15.4)
WBC: 6.3 10*3/uL (ref 3.4–10.8)

## 2018-09-02 LAB — LIPID PANEL
CHOL/HDL RATIO: 4.4 ratio (ref 0.0–4.4)
CHOLESTEROL TOTAL: 177 mg/dL (ref 100–199)
HDL: 40 mg/dL (ref >39)
LDL CALC: 93 mg/dL (ref 0–99)
TRIGLYCERIDES: 218 mg/dL — AB (ref 0–149)
VLDL Cholesterol Cal: 44 mg/dL — ABNORMAL HIGH (ref 5–40)

## 2018-09-02 LAB — COMPREHENSIVE METABOLIC PANEL
ALT: 15 IU/L (ref 0–32)
AST: 22 IU/L (ref 0–40)
Albumin/Globulin Ratio: 2 (ref 1.2–2.2)
Albumin: 4.8 g/dL (ref 3.6–4.8)
Alkaline Phosphatase: 68 IU/L (ref 39–117)
BUN/Creatinine Ratio: 27 (ref 12–28)
BUN: 17 mg/dL (ref 8–27)
Bilirubin Total: 0.5 mg/dL (ref 0.0–1.2)
CALCIUM: 10 mg/dL (ref 8.7–10.3)
CO2: 26 mmol/L (ref 20–29)
CREATININE: 0.64 mg/dL (ref 0.57–1.00)
Chloride: 102 mmol/L (ref 96–106)
GFR calc Af Amer: 108 mL/min/{1.73_m2} (ref >59)
GFR, EST NON AFRICAN AMERICAN: 94 mL/min/{1.73_m2} (ref >59)
GLUCOSE: 103 mg/dL — AB (ref 65–99)
Globulin, Total: 2.4 g/dL (ref 1.5–4.5)
Potassium: 4.5 mmol/L (ref 3.5–5.2)
Sodium: 143 mmol/L (ref 134–144)
Total Protein: 7.2 g/dL (ref 6.0–8.5)

## 2018-09-02 LAB — VITAMIN D 25 HYDROXY (VIT D DEFICIENCY, FRACTURES): Vit D, 25-Hydroxy: 35.9 ng/mL (ref 30.0–100.0)

## 2018-10-30 ENCOUNTER — Other Ambulatory Visit: Payer: Self-pay | Admitting: Family Medicine

## 2019-07-28 ENCOUNTER — Encounter: Payer: Self-pay | Admitting: Genetic Counselor

## 2019-09-03 ENCOUNTER — Encounter: Payer: 59 | Admitting: Family Medicine

## 2019-09-23 ENCOUNTER — Ambulatory Visit (INDEPENDENT_AMBULATORY_CARE_PROVIDER_SITE_OTHER): Payer: Medicare HMO | Admitting: Family Medicine

## 2019-09-23 ENCOUNTER — Other Ambulatory Visit: Payer: Self-pay

## 2019-09-23 ENCOUNTER — Encounter: Payer: Self-pay | Admitting: Family Medicine

## 2019-09-23 VITALS — BP 144/90 | HR 78 | Temp 97.0°F | Ht 62.5 in | Wt 172.6 lb

## 2019-09-23 DIAGNOSIS — E559 Vitamin D deficiency, unspecified: Secondary | ICD-10-CM

## 2019-09-23 DIAGNOSIS — E785 Hyperlipidemia, unspecified: Secondary | ICD-10-CM

## 2019-09-23 DIAGNOSIS — Z23 Encounter for immunization: Secondary | ICD-10-CM | POA: Diagnosis not present

## 2019-09-23 DIAGNOSIS — Z853 Personal history of malignant neoplasm of breast: Secondary | ICD-10-CM | POA: Diagnosis not present

## 2019-09-23 DIAGNOSIS — Z9013 Acquired absence of bilateral breasts and nipples: Secondary | ICD-10-CM | POA: Diagnosis not present

## 2019-09-23 DIAGNOSIS — M8580 Other specified disorders of bone density and structure, unspecified site: Secondary | ICD-10-CM

## 2019-09-23 DIAGNOSIS — E7439 Other disorders of intestinal carbohydrate absorption: Secondary | ICD-10-CM | POA: Diagnosis not present

## 2019-09-23 DIAGNOSIS — R030 Elevated blood-pressure reading, without diagnosis of hypertension: Secondary | ICD-10-CM | POA: Diagnosis not present

## 2019-09-23 MED ORDER — ROSUVASTATIN CALCIUM 20 MG PO TABS: 20.0000 mg | ORAL_TABLET | Freq: Every day | ORAL | 3 refills | Status: DC

## 2019-09-23 NOTE — Patient Instructions (Signed)
  [name] , Thank you for taking time to come for your Medicare Wellness Visit. I appreciate your ongoing commitment to your health goals. Please review the following plan we discussed and let me know if I can assist you in the future.   These are the goals we discussed: 20 minutes of something physical every day or 150 minutes a week of something physical.  In terms of your weight cut back on carbohydrates and the easiest way to remember that his "white food".  Come back here in a month we will check your pressure again This is a list of the screening recommended for you and due dates:  Health Maintenance  Topic Date Due  . Pneumonia vaccines (2 of 2 - PPSV23) 09/02/2019  . Cologuard (Stool DNA test)  02/25/2020  . Tetanus Vaccine  09/01/2028  . Flu Shot  Completed  . DEXA scan (bone density measurement)  Completed  .  Hepatitis C: One time screening is recommended by Center for Disease Control  (CDC) for  adults born from [birth year] through 1964.   Completed  . Mammogram  Discontinued

## 2019-09-23 NOTE — Progress Notes (Signed)
[name] is a 68 y.o. female who presents for annual wellness visit and follow-up on chronic medical conditions.  She has a previous history of chemotherapy-induced osteoporosis and subsequent bisphosphonate therapy and is now osteopenic.  She will need another DEXA scan.  She has had a Pap smear done within the last several years.  She has had bilateral mastectomy and therefore mammograms are not needed.  She does have a history of vitamin D deficiency.  She continues on Crestor and having no difficulty with that.  She is also taking multivitamin as well as extra vitamin D.  She has a history of glucose intolerance.  She continues to work.  Immunizations and Health Maintenance Immunization History  Administered Date(s) Administered  . Influenza Split 07/27/2012, 07/29/2013, 08/10/2015  . Influenza Whole 07/16/2010, 08/05/2011  . Influenza, High Dose Seasonal PF 08/16/2018, 08/20/2019  . Influenza,inj,Quad PF,6+ Mos 08/12/2017  . Influenza-Unspecified 08/25/2016, 08/20/2019  . Pneumococcal Conjugate-13 09/01/2018  . Rabies, IM 07/15/2012, 07/18/2012  . Tdap 10/21/2008, 09/01/2018  . Zoster 09/08/2013  . Zoster Recombinat (Shingrix) 02/07/2017, 08/12/2017   Health Maintenance Due  Topic Date Due  . PNA vac Low Risk Adult (2 of 2 - PPSV23) 09/02/2019    Last Pap smear: over three years Last mammogram: N/A Last colonoscopy:02/24/17 Last DEXA: 08/26/17 Dentist: Q six months Ophtho: 2019 Q two years Exercise: walking  Other doctors caring for patient include: [facility]   Advanced directives:  Yes.  Note on file.  Asked to drop off a copy.  Depression screen:  See questionnaire below.  Depression screen [facility] 2/9 09/23/2019 09/01/2018 10/10/2017 08/29/2016 09/08/2013  Decreased Interest 0 0 0 0 0  Down, Depressed, Hopeless 0 0 0 0 0  PHQ - 2 Score 0 0 0 0 0    Fall Risk Screen: see questionnaire below. Fall Risk  09/23/2019 08/29/2016  Falls in the past year? 0 No    ADL  screen:  See questionnaire below Functional Status Survey: Is the patient deaf or have difficulty hearing?: No Does the patient have difficulty seeing, even when wearing glasses/contacts?: No Does the patient have difficulty concentrating, remembering, or making decisions?: No Does the patient have difficulty walking or climbing stairs?: No Does the patient have difficulty dressing or bathing?: No Does the patient have difficulty doing errands alone such as visiting a doctor's office or shopping?: No   Review of Systems Constitutional: -, -unexpected weight change, -anorexia, -fatigue Allergy: -sneezing, -itching, -congestion Dermatology: denies changing moles, rash, lumps ENT: -runny nose, -ear pain, -sore throat,  Cardiology:  -chest pain, -palpitations, -orthopnea, Respiratory: -cough, -shortness of breath, -dyspnea on exertion, -wheezing,  Gastroenterology: -abdominal pain, -nausea, -vomiting, -diarrhea, -constipation, -dysphagia Hematology: -bleeding or bruising problems Musculoskeletal: -arthralgias, -myalgias, -joint swelling, -back pain, - Ophthalmology: -vision changes,  Urology: -dysuria, -difficulty urinating,  -urinary frequency, -urgency, incontinence Neurology: -, -numbness, , -memory loss, -falls, -dizziness    PHYSICAL EXAM:  BP (!) 144/90 (BP Location: Left Arm, Patient Position: Sitting)   Pulse 78   Temp (!) 97 F (36.1 C)   Ht 5' 2.5" (1.588 m)   Wt 172 lb 9.6 oz (78.3 kg)   LMP 02/20/1999 (Exact Date)   SpO2 95%   BMI 31.07 kg/m   General Appearance: Alert, cooperative, no distress, appears stated age Head: Normocephalic, without obvious abnormality, atraumatic Eyes: PERRL, conjunctiva/corneas clear, EOM's intact,  Ears: Normal TM's and external ear canals  Throat: Lips, mucosa, and tongue normal; teeth and gums normal Neck: Supple, no lymphadenopathy;  thyroid:  no enlargement/tenderness/nodules; no carotid bruit or JVD Lungs: Clear to auscultation  bilaterally without wheezes, rales or ronchi; respirations unlabored Heart: Regular rate and rhythm, S1 and S2 normal, no murmur, rubor gallop Abdomen: Soft, non-tender, nondistended, normoactive bowel sounds, no masses, no hepatosplenomegaly Skin:  Skin color, texture, turgor normal, no rashes or lesions Lymph nodes: Cervical, supraclavicular, and axillary nodes normal Neurologic:  CNII-XII intact, normal strength, sensation and gait; reflexes 2+ and symmetric throughout Psych: Normal mood, affect, hygiene and grooming.  ASSESSMENT/PLAN: Need for vaccination against Streptococcus pneumoniae - Plan: Pneumococcal polysaccharide vaccine 23-valent greater than or equal to 2yo subcutaneous/IM  Elevated blood pressure reading - Plan: CBC with Differential, Comprehensive metabolic panel  Hyperlipidemia with target LDL less than 100 - Plan: Lipid panel  Vitamin D deficiency - Plan: Vitamin D 25 hydroxy  Glucose intolerance - Plan: Hemoglobin A1c  History of breast cancer in female  Osteopenia, unspecified location - Plan: DG Bone Density  H/O bilateral mastectomy Recommended 20 minutes of something physical daily or 150 minutes a week of something physical.  Also discussed dietary modification especially in regard to cutting back on carbohydrates.  She will return here in 1 month for recheck on her blood pressure.  Immunization recommendations discussed.  Colonoscopy recommendations reviewed   Medicare Attestation I have personally reviewed: The patient's medical and social history Their use of alcohol, tobacco or illicit drugs Their current medications and supplements The patient's functional ability including ADLs,fall risks, home safety risks, cognitive, and hearing and visual impairment Diet and physical activities Evidence for depression or mood disorders  The patient's weight, height, and BMI have been recorded in the chart.  I have made referrals, counseling, and provided  education to the patient based on review of the above and I have provided the patient with a written personalized care plan for preventive services.     Jill Alexanders, MD   09/23/2019

## 2019-09-24 LAB — CBC WITH DIFFERENTIAL/PLATELET
Basophils Absolute: 0 10*3/uL (ref 0.0–0.2)
Basos: 1 %
EOS (ABSOLUTE): 0.2 10*3/uL (ref 0.0–0.4)
Eos: 3 %
Hematocrit: 40.8 % (ref 34.0–46.6)
Hemoglobin: 13.7 g/dL (ref 11.1–15.9)
Immature Grans (Abs): 0 10*3/uL (ref 0.0–0.1)
Immature Granulocytes: 0 %
Lymphocytes Absolute: 2.7 10*3/uL (ref 0.7–3.1)
Lymphs: 39 %
MCH: 30.2 pg (ref 26.6–33.0)
MCHC: 33.6 g/dL (ref 31.5–35.7)
MCV: 90 fL (ref 79–97)
Monocytes Absolute: 0.5 10*3/uL (ref 0.1–0.9)
Monocytes: 8 %
Neutrophils Absolute: 3.3 10*3/uL (ref 1.4–7.0)
Neutrophils: 49 %
Platelets: 265 10*3/uL (ref 150–450)
RBC: 4.53 x10E6/uL (ref 3.77–5.28)
RDW: 13.2 % (ref 11.7–15.4)
WBC: 6.8 10*3/uL (ref 3.4–10.8)

## 2019-09-24 LAB — LIPID PANEL
Chol/HDL Ratio: 3.7 ratio (ref 0.0–4.4)
Cholesterol, Total: 155 mg/dL (ref 100–199)
HDL: 42 mg/dL (ref >39)
LDL Chol Calc (NIH): 79 mg/dL (ref 0–99)
Triglycerides: 204 mg/dL — ABNORMAL HIGH (ref 0–149)
VLDL Cholesterol Cal: 34 mg/dL (ref 5–40)

## 2019-09-24 LAB — COMPREHENSIVE METABOLIC PANEL
ALT: 12 IU/L (ref 0–32)
AST: 16 IU/L (ref 0–40)
Albumin/Globulin Ratio: 1.8 (ref 1.2–2.2)
Albumin: 4.6 g/dL (ref 3.8–4.8)
Alkaline Phosphatase: 72 IU/L (ref 39–117)
BUN/Creatinine Ratio: 24 (ref 12–28)
BUN: 16 mg/dL (ref 8–27)
Bilirubin Total: 0.5 mg/dL (ref 0.0–1.2)
CO2: 24 mmol/L (ref 20–29)
Calcium: 9.7 mg/dL (ref 8.7–10.3)
Chloride: 104 mmol/L (ref 96–106)
Creatinine, Ser: 0.68 mg/dL (ref 0.57–1.00)
GFR calc Af Amer: 105 mL/min/{1.73_m2} (ref >59)
GFR calc non Af Amer: 91 mL/min/{1.73_m2} (ref >59)
Globulin, Total: 2.5 g/dL (ref 1.5–4.5)
Glucose: 121 mg/dL — ABNORMAL HIGH (ref 65–99)
Potassium: 4.7 mmol/L (ref 3.5–5.2)
Sodium: 144 mmol/L (ref 134–144)
Total Protein: 7.1 g/dL (ref 6.0–8.5)

## 2019-09-24 LAB — HEMOGLOBIN A1C
Est. average glucose Bld gHb Est-mCnc: 128 mg/dL
Hgb A1c MFr Bld: 6.1 % — ABNORMAL HIGH (ref 4.8–5.6)

## 2019-09-24 LAB — VITAMIN D 25 HYDROXY (VIT D DEFICIENCY, FRACTURES): Vit D, 25-Hydroxy: 27.3 ng/mL — ABNORMAL LOW (ref 30.0–100.0)

## 2019-10-29 ENCOUNTER — Other Ambulatory Visit: Payer: 59

## 2019-10-29 ENCOUNTER — Ambulatory Visit: Payer: Medicare HMO | Admitting: Family Medicine

## 2019-12-31 ENCOUNTER — Other Ambulatory Visit: Payer: 59

## 2020-02-25 ENCOUNTER — Ambulatory Visit
Admission: RE | Admit: 2020-02-25 | Discharge: 2020-02-25 | Disposition: A | Discharge status: Home / Self Care | Payer: Medicare HMO | Source: Ambulatory Visit | Attending: Family Medicine | Admitting: Family Medicine

## 2020-02-25 ENCOUNTER — Other Ambulatory Visit: Payer: Self-pay

## 2020-02-25 DIAGNOSIS — Z780 Asymptomatic menopausal state: Secondary | ICD-10-CM | POA: Diagnosis not present

## 2020-02-25 DIAGNOSIS — M8580 Other specified disorders of bone density and structure, unspecified site: Secondary | ICD-10-CM

## 2020-02-25 DIAGNOSIS — M85832 Other specified disorders of bone density and structure, left forearm: Secondary | ICD-10-CM | POA: Diagnosis not present

## 2020-06-30 DIAGNOSIS — H52203 Unspecified astigmatism, bilateral: Secondary | ICD-10-CM | POA: Diagnosis not present

## 2020-06-30 DIAGNOSIS — H25041 Posterior subcapsular polar age-related cataract, right eye: Secondary | ICD-10-CM | POA: Diagnosis not present

## 2020-06-30 DIAGNOSIS — H2513 Age-related nuclear cataract, bilateral: Secondary | ICD-10-CM | POA: Diagnosis not present

## 2020-07-01 DIAGNOSIS — Z20822 Contact with and (suspected) exposure to covid-19: Secondary | ICD-10-CM | POA: Diagnosis not present

## 2020-07-01 DIAGNOSIS — Z03818 Encounter for observation for suspected exposure to other biological agents ruled out: Secondary | ICD-10-CM | POA: Diagnosis not present

## 2020-08-25 ENCOUNTER — Other Ambulatory Visit: Payer: Self-pay

## 2020-09-16 LAB — COLOGUARD: Cologuard: NEGATIVE

## 2020-10-01 ENCOUNTER — Other Ambulatory Visit: Payer: Self-pay | Admitting: Family Medicine

## 2020-10-01 DIAGNOSIS — E785 Hyperlipidemia, unspecified: Secondary | ICD-10-CM

## 2020-10-06 ENCOUNTER — Ambulatory Visit (INDEPENDENT_AMBULATORY_CARE_PROVIDER_SITE_OTHER): Payer: Medicare HMO | Admitting: Family Medicine

## 2020-10-06 ENCOUNTER — Other Ambulatory Visit: Payer: Self-pay

## 2020-10-06 VITALS — BP 150/80 | HR 73 | Temp 97.4°F | Ht 63.0 in | Wt 178.2 lb

## 2020-10-06 DIAGNOSIS — M8580 Other specified disorders of bone density and structure, unspecified site: Secondary | ICD-10-CM | POA: Diagnosis not present

## 2020-10-06 DIAGNOSIS — I10 Essential (primary) hypertension: Secondary | ICD-10-CM | POA: Diagnosis not present

## 2020-10-06 DIAGNOSIS — E785 Hyperlipidemia, unspecified: Secondary | ICD-10-CM | POA: Diagnosis not present

## 2020-10-06 DIAGNOSIS — Z0000 Encounter for general adult medical examination without abnormal findings: Secondary | ICD-10-CM | POA: Diagnosis not present

## 2020-10-06 DIAGNOSIS — Z853 Personal history of malignant neoplasm of breast: Secondary | ICD-10-CM

## 2020-10-06 DIAGNOSIS — Z1211 Encounter for screening for malignant neoplasm of colon: Secondary | ICD-10-CM

## 2020-10-06 DIAGNOSIS — E7439 Other disorders of intestinal carbohydrate absorption: Secondary | ICD-10-CM

## 2020-10-06 DIAGNOSIS — Z9013 Acquired absence of bilateral breasts and nipples: Secondary | ICD-10-CM | POA: Diagnosis not present

## 2020-10-06 DIAGNOSIS — Z131 Encounter for screening for diabetes mellitus: Secondary | ICD-10-CM

## 2020-10-06 DIAGNOSIS — E559 Vitamin D deficiency, unspecified: Secondary | ICD-10-CM | POA: Diagnosis not present

## 2020-10-06 LAB — POCT GLYCOSYLATED HEMOGLOBIN (HGB A1C): Hemoglobin A1C: 6.2 % — AB (ref 4.0–5.6)

## 2020-10-06 MED ORDER — LOSARTAN POTASSIUM 50 MG PO TABS: 50.0000 mg | ORAL_TABLET | Freq: Every day | ORAL | 3 refills | Status: DC

## 2020-10-06 MED ORDER — ROSUVASTATIN CALCIUM 20 MG PO TABS: ORAL_TABLET | ORAL | 3 refills | Status: DC

## 2020-10-06 NOTE — Patient Instructions (Signed)
°  [name] , Thank you for taking time to come for your Medicare Wellness Visit. I appreciate your ongoing commitment to your health goals. Please review the following plan we discussed and let me know if I can assist you in the future.   These are the goals we discussed: 20 minutes of something physical daily or 150 minutes a week of physical activity.  Cut back on carbohydrates: Bread, rice, pasta, potatoes and sugar This is a list of the screening recommended for you and due dates:  Health Maintenance  Topic Date Due   Cologuard (Stool DNA test)  02/25/2020   Tetanus Vaccine  09/01/2028   Flu Shot  Completed   DEXA scan (bone density measurement)  Completed   COVID-19 Vaccine  Completed    Hepatitis C: One time screening is recommended by Center for Disease Control  (CDC) for  adults born from [birth year] through 1964.   Completed   Pneumonia vaccines  Completed   Mammogram  Discontinued

## 2020-10-06 NOTE — Progress Notes (Signed)
[name] is a 69 y.o. female who presents for annual wellness visit and follow-up on chronic medical conditions.  She has no particular concerns or complaints.  She does have a previous history of breast cancer with bilateral mastectomy.  She also has a history of vitamin D deficiency but has been on a multivitamin with extra vitamin D.  Recent DEXA did show evidence of osteopenia.  She also has a history of glucose intolerance but so far has not made any major changes in her diet and exercise.  Review of the record indicates need for colon cancer screening presently she is on multivitamin supplementation, Crestor.. Immunizations and Health Maintenance Immunization History  Administered Date(s) Administered   Influenza Split 07/27/2012, 07/29/2013, 08/10/2015   Influenza Whole 07/16/2010, 08/05/2011   Influenza, High Dose Seasonal PF 08/16/2018, 08/20/2019   Influenza,inj,Quad PF,6+ Mos 08/12/2017   Influenza-Unspecified 08/25/2016, 08/20/2019, 06/24/2020   Moderna Sars-Covid-2 Vaccination 12/03/2019, 12/31/2019, 09/01/2020   Pneumococcal Conjugate-13 09/01/2018   Pneumococcal Polysaccharide-23 09/23/2019   Rabies, IM 07/15/2012, 07/18/2012   Tdap 10/21/2008, 09/01/2018   Zoster 09/08/2013   Zoster Recombinat (Shingrix) 02/07/2017, 08/12/2017   Health Maintenance Due  Topic Date Due   Fecal DNA (Cologuard)  02/25/2020    Last Pap smear: Last mammogram: Last colonoscopy: Last DEXA: Dentist:Sayf Ophtho:hollainder Exercise:walking  Other doctors caring for patient include:None  Advanced directives: Does Patient Have a Medical Advance Directive?: Yes Type of Advance Directive: Living will Does patient want to make changes to medical advance directive?: Yes (ED - Information included in AVS)  Depression screen:  See questionnaire below.  Depression screen [facility] 2/9 10/06/2020 09/23/2019 09/01/2018 10/10/2017 08/29/2016  Decreased Interest 0 0 0 0 0  Down,  Depressed, Hopeless 0 0 0 0 0  PHQ - 2 Score 0 0 0 0 0    Fall Risk Screen: see questionnaire below. Fall Risk  10/06/2020 09/23/2019 08/29/2016  Falls in the past year? 0 0 No  Risk for fall due to : No Fall Risks - -    ADL screen:  See questionnaire below Functional Status Survey: Is the patient deaf or have difficulty hearing?: No Does the patient have difficulty seeing, even when wearing glasses/contacts?: No Does the patient have difficulty concentrating, remembering, or making decisions?: No Does the patient have difficulty walking or climbing stairs?: No Does the patient have difficulty dressing or bathing?: No Does the patient have difficulty doing errands alone such as visiting a doctor's office or shopping?: No   Review of Systems Constitutional: -, -unexpected weight change, -anorexia, -fatigue Allergy: -sneezing, -itching, -congestion Dermatology: denies changing moles, rash, lumps ENT: -runny nose, -ear pain, -sore throat,  Cardiology:  -chest pain, -palpitations, -orthopnea, Respiratory: -cough, -shortness of breath, -dyspnea on exertion, -wheezing,  Gastroenterology: -abdominal pain, -nausea, -vomiting, -diarrhea, -constipation, -dysphagia Hematology: -bleeding or bruising problems Musculoskeletal: -arthralgias, -myalgias, -joint swelling, -back pain, - Ophthalmology: -vision changes,  Urology: -dysuria, -difficulty urinating,  -urinary frequency, -urgency, incontinence Neurology: -, -numbness, , -memory loss, -falls, -dizziness    PHYSICAL EXAM:  BP (!) 150/80    Pulse 73    Temp (!) 97.4 F (36.3 C)    Ht 5\' 3"  (1.6 m)    Wt 178 lb 3.2 oz (80.8 kg)    LMP 02/20/1999 (Exact Date)    SpO2 95%    BMI 31.57 kg/m   General Appearance: Alert, cooperative, no distress, appears stated age Head: Normocephalic, without obvious abnormality, atraumatic Eyes: PERRL, conjunctiva/corneas clear, EOM's intact, Ears: Normal TM's and  external ear canals Nose: Nares normal,  mucosa normal, no drainage or sinus tenderness Throat: Lips, mucosa, and tongue normal; teeth and gums normal Neck: Supple, no lymphadenopathy;  thyroid:  no enlargement/tenderness/nodules; no carotid bruit or JVD Lungs: Clear to auscultation bilaterally without wheezes, rales or ronchi; respirations unlabored Heart: Regular rate and rhythm, S1 and S2 normal, no murmur, rubor gallop Abdomen: Soft, non-tender, nondistended, normoactive bowel sounds,  no masses, no hepatosplenomegaly Skin:  Skin color, texture, turgor normal, no rashes or lesions Lymph nodes: Cervical, supraclavicular, and axillary nodes normal Neurologic:  CNII-XII intact, normal strength, sensation and gait; reflexes 2+ and symmetric throughout Psych: Normal mood, affect, hygiene and grooming. Hemoglobin A1c is 6.2 ASSESSMENT/PLAN: Routine general medical examination at a health care facility - Plan: CBC with Differential/Platelet, Comprehensive metabolic panel, Lipid panel  Hyperlipidemia with target LDL less than 100 - Plan: Lipid panel, rosuvastatin (CRESTOR) 20 MG tablet  History of breast cancer in female  Glucose intolerance  Vitamin D deficiency - Plan: VITAMIN D 25 Hydroxy (Vit-D Deficiency, Fractures)  Osteopenia, unspecified location - Plan: VITAMIN D 25 Hydroxy (Vit-D Deficiency, Fractures)  H/O bilateral mastectomy  Essential hypertension - Plan: CBC with Differential/Platelet, Comprehensive metabolic panel, losartan (COZAAR) 50 MG tablet  Screening for diabetes mellitus - Plan: POCT glycosylated hemoglobin (Hb A1C)  Screening for colon cancer - Plan: Cologuard Discussed her risk of diabetes.  Also discussed getting another DEXA on a 2-year cycle. Discussed  at least 30 minutes of aerobic activity at least 5 days/week and weight-bearing exercise 2x/week;  healthy diet, including goals of calcium and vitamin D intake Immunization recommendations discussed.  Colonoscopy recommendations reviewed Return here  in 1 month with blood pressure cuff and adjust accordingly  Medicare Attestation I have personally reviewed: The patient's medical and social history Their use of alcohol, tobacco or illicit drugs Their current medications and supplements The patient's functional ability including ADLs,fall risks, home safety risks, cognitive, and hearing and visual impairment Diet and physical activities Evidence for depression or mood disorders  The patient's weight, height, and BMI have been recorded in the chart.  I have made referrals, counseling, and provided education to the patient based on review of the above and I have provided the patient with a written personalized care plan for preventive services.     Jill Alexanders, MD   10/06/2020  Lendon Colonel Schmelter is a 69 y.o. female who presents for annual wellness visit and follow-up on chronic medical conditions.  She has the following concerns:  Immunizations and Health Maintenance Immunization History  Administered Date(s) Administered   Influenza Split 07/27/2012, 07/29/2013, 08/10/2015   Influenza Whole 07/16/2010, 08/05/2011   Influenza, High Dose Seasonal PF 08/16/2018, 08/20/2019   Influenza,inj,Quad PF,6+ Mos 08/12/2017   Influenza-Unspecified 08/25/2016, 08/20/2019, 06/24/2020   Moderna Sars-Covid-2 Vaccination 12/03/2019, 12/31/2019, 09/01/2020   Pneumococcal Conjugate-13 09/01/2018   Pneumococcal Polysaccharide-23 09/23/2019   Rabies, IM 07/15/2012, 07/18/2012   Tdap 10/21/2008, 09/01/2018   Zoster 09/08/2013   Zoster Recombinat (Shingrix) 02/07/2017, 08/12/2017   Health Maintenance Due  Topic Date Due   Fecal DNA (Cologuard)  02/25/2020    Last Pap smear:  Aged out Last mammogram: 11/22/03 Last colonoscopy: cologuard 09/22/20  Last DEXA: 02/25/20 Dentist: Q six months Ophtho: Q year Exercise: walking 30 min. 3 days a week  Other doctors caring for patient include: Dr. Delman Cheadle eye,   Advanced directives: Does  Patient Have a Medical Advance Directive?: Yes Type of Advance Directive: Living will Does patient want to make  changes to medical advance directive?: Yes (ED - Information included in AVS)  Depression screen:  See questionnaire below.  Depression screen [facility] 2/9 10/06/2020 09/23/2019 09/01/2018 10/10/2017 08/29/2016  Decreased Interest 0 0 0 0 0  Down, Depressed, Hopeless 0 0 0 0 0  PHQ - 2 Score 0 0 0 0 0    Fall Risk Screen: see questionnaire below. Fall Risk  10/06/2020 09/23/2019 08/29/2016  Falls in the past year? 0 0 No  Risk for fall due to : No Fall Risks - -    ADL screen:  See questionnaire below Functional Status Survey: Is the patient deaf or have difficulty hearing?: No Does the patient have difficulty seeing, even when wearing glasses/contacts?: No Does the patient have difficulty concentrating, remembering, or making decisions?: No Does the patient have difficulty walking or climbing stairs?: No Does the patient have difficulty dressing or bathing?: No Does the patient have difficulty doing errands alone such as visiting a doctor's office or shopping?: No   Review of Systems Constitutional: -, -unexpected weight change, -anorexia, -fatigue Allergy: -sneezing, -itching, -congestion Dermatology: denies changing moles, rash, lumps ENT: -runny nose, -ear pain, -sore throat,  Cardiology:  -chest pain, -palpitations, -orthopnea, Respiratory: -cough, -shortness of breath, -dyspnea on exertion, -wheezing,  Gastroenterology: -abdominal pain, -nausea, -vomiting, -diarrhea, -constipation, -dysphagia Hematology: -bleeding or bruising problems Musculoskeletal: -arthralgias, -myalgias, -joint swelling, -back pain, - Ophthalmology: -vision changes,  Urology: -dysuria, -difficulty urinating,  -urinary frequency, -urgency, incontinence Neurology: -, -numbness, , -memory loss, -falls, -dizziness    PHYSICAL EXAM:  BP (!) 150/80    Pulse 73    Temp (!) 97.4 F (36.3 C)    Ht 5'  3" (1.6 m)    Wt 178 lb 3.2 oz (80.8 kg)    LMP 02/20/1999 (Exact Date)    SpO2 95%    BMI 31.57 kg/m   General Appearance: Alert, cooperative, no distress, appears stated age Head: Normocephalic, without obvious abnormality, atraumatic Eyes: PERRL, conjunctiva/corneas clear, EOM's intact, fundi benign Ears: Normal TM's and external ear canals Nose: Nares normal, mucosa normal, no drainage or sinus tenderness Throat: Lips, mucosa, and tongue normal; teeth and gums normal Neck: Supple, no lymphadenopathy;  thyroid:  no enlargement/tenderness/nodules; no carotid bruit or JVD Lungs: Clear to auscultation bilaterally without wheezes, rales or ronchi; respirations unlabored Heart: Regular rate and rhythm, S1 and S2 normal, no murmur, rubor gallop Abdomen: Soft, non-tender, nondistended, normoactive bowel sounds,  no masses, no hepatosplenomegaly Extremities: No clubbing, cyanosis or edema Pulses: 2+ and symmetric all extremities Skin:  Skin color, texture, turgor normal, no rashes or lesions Lymph nodes: Cervical, supraclavicular, and axillary nodes normal Neurologic:  CNII-XII intact, normal strength, sensation and gait; reflexes 2+ and symmetric throughout Psych: Normal mood, affect, hygiene and grooming.  ASSESSMENT/PLAN:    Discussed monthly self breast exams and yearly mammograms; at least 30 minutes of aerobic activity at least 5 days/week and weight-bearing exercise 2x/week; proper sunscreen use reviewed; healthy diet, including goals of calcium and vitamin D intake and alcohol recommendations (less than or equal to 1 drink/day) reviewed; regular seatbelt use; changing batteries in smoke detectors.  Immunization recommendations discussed.  Colonoscopy recommendations reviewed   Medicare Attestation I have personally reviewed: The patient's medical and social history Their use of alcohol, tobacco or illicit drugs Their current medications and supplements The patient's functional  ability including ADLs,fall risks, home safety risks, cognitive, and hearing and visual impairment Diet and physical activities Evidence for depression or mood disorders  The patient's  weight, height, and BMI have been recorded in the chart.  I have made referrals, counseling, and provided education to the patient based on review of the above and I have provided the patient with a written personalized care plan for preventive services.     Jill Alexanders, MD   10/06/2020

## 2020-10-07 LAB — CBC WITH DIFFERENTIAL/PLATELET
Basophils Absolute: 0.1 10*3/uL (ref 0.0–0.2)
Basos: 1 %
EOS (ABSOLUTE): 0.2 10*3/uL (ref 0.0–0.4)
Eos: 3 %
Hematocrit: 42.9 % (ref 34.0–46.6)
Hemoglobin: 14 g/dL (ref 11.1–15.9)
Immature Grans (Abs): 0 10*3/uL (ref 0.0–0.1)
Immature Granulocytes: 0 %
Lymphocytes Absolute: 2.4 10*3/uL (ref 0.7–3.1)
Lymphs: 32 %
MCH: 29.5 pg (ref 26.6–33.0)
MCHC: 32.6 g/dL (ref 31.5–35.7)
MCV: 90 fL (ref 79–97)
Monocytes Absolute: 0.6 10*3/uL (ref 0.1–0.9)
Monocytes: 9 %
Neutrophils Absolute: 4.2 10*3/uL (ref 1.4–7.0)
Neutrophils: 55 %
Platelets: 265 10*3/uL (ref 150–450)
RBC: 4.75 x10E6/uL (ref 3.77–5.28)
RDW: 13.3 % (ref 11.7–15.4)
WBC: 7.4 10*3/uL (ref 3.4–10.8)

## 2020-10-07 LAB — COMPREHENSIVE METABOLIC PANEL
ALT: 17 IU/L (ref 0–32)
AST: 16 IU/L (ref 0–40)
Albumin/Globulin Ratio: 1.5 (ref 1.2–2.2)
Albumin: 4.5 g/dL (ref 3.8–4.8)
Alkaline Phosphatase: 75 IU/L (ref 44–121)
BUN/Creatinine Ratio: 25 (ref 12–28)
BUN: 17 mg/dL (ref 8–27)
Bilirubin Total: 0.5 mg/dL (ref 0.0–1.2)
CO2: 24 mmol/L (ref 20–29)
Calcium: 9.3 mg/dL (ref 8.7–10.3)
Chloride: 105 mmol/L (ref 96–106)
Creatinine, Ser: 0.67 mg/dL (ref 0.57–1.00)
GFR calc Af Amer: 105 mL/min/{1.73_m2} (ref >59)
GFR calc non Af Amer: 91 mL/min/{1.73_m2} (ref >59)
Globulin, Total: 3 g/dL (ref 1.5–4.5)
Glucose: 118 mg/dL — ABNORMAL HIGH (ref 65–99)
Potassium: 4.5 mmol/L (ref 3.5–5.2)
Sodium: 143 mmol/L (ref 134–144)
Total Protein: 7.5 g/dL (ref 6.0–8.5)

## 2020-10-07 LAB — LIPID PANEL
Chol/HDL Ratio: 5.2 ratio — ABNORMAL HIGH (ref 0.0–4.4)
Cholesterol, Total: 197 mg/dL (ref 100–199)
HDL: 38 mg/dL — ABNORMAL LOW (ref >39)
LDL Chol Calc (NIH): 109 mg/dL — ABNORMAL HIGH (ref 0–99)
Triglycerides: 290 mg/dL — ABNORMAL HIGH (ref 0–149)
VLDL Cholesterol Cal: 50 mg/dL — ABNORMAL HIGH (ref 5–40)

## 2020-10-07 LAB — VITAMIN D 25 HYDROXY (VIT D DEFICIENCY, FRACTURES): Vit D, 25-Hydroxy: 24.7 ng/mL — ABNORMAL LOW (ref 30.0–100.0)

## 2020-10-13 DIAGNOSIS — Z1211 Encounter for screening for malignant neoplasm of colon: Secondary | ICD-10-CM | POA: Diagnosis not present

## 2020-10-15 LAB — COLOGUARD: Cologuard: NEGATIVE

## 2020-10-22 LAB — COLOGUARD: COLOGUARD: NEGATIVE

## 2020-10-22 LAB — EXTERNAL GENERIC LAB PROCEDURE: COLOGUARD: NEGATIVE

## 2020-10-25 ENCOUNTER — Telehealth: Payer: Self-pay

## 2020-10-25 NOTE — Telephone Encounter (Signed)
Called pt to advise of negative cologuard. No answer and could not leave a voice mail. KH

## 2020-11-03 ENCOUNTER — Ambulatory Visit: Payer: Medicare HMO | Admitting: Family Medicine

## 2020-11-17 ENCOUNTER — Ambulatory Visit (INDEPENDENT_AMBULATORY_CARE_PROVIDER_SITE_OTHER): Payer: Medicare HMO | Admitting: Family Medicine

## 2020-11-17 ENCOUNTER — Encounter: Payer: Self-pay | Admitting: Family Medicine

## 2020-11-17 VITALS — BP 124/78 | HR 74 | Temp 98.3°F | Wt 179.8 lb

## 2020-11-17 DIAGNOSIS — I10 Essential (primary) hypertension: Secondary | ICD-10-CM | POA: Diagnosis not present

## 2020-11-17 NOTE — Progress Notes (Signed)
   Subjective:    Patient ID: [name], female    DOB: [birth year], 70 y.o.   MRN: [id number]  HPI She is here for a blood pressure recheck.  She is now taking losartan and having no difficulty with that.   Review of Systems     Objective:   Physical Exam Alert and in no distress.  Blood pressure is recorded.       Assessment & Plan:  Essential hypertension Continue on present medication regimen.  Apparently the drugstore gave her a 90-day supply with 3 refills.

## 2020-11-20 DIAGNOSIS — Z20822 Contact with and (suspected) exposure to covid-19: Secondary | ICD-10-CM | POA: Diagnosis not present

## 2020-11-29 ENCOUNTER — Encounter: Payer: Self-pay | Admitting: Family Medicine

## 2021-02-01 ENCOUNTER — Other Ambulatory Visit: Payer: Self-pay | Admitting: Family Medicine

## 2021-02-01 DIAGNOSIS — I10 Essential (primary) hypertension: Secondary | ICD-10-CM

## 2021-04-10 ENCOUNTER — Ambulatory Visit (INDEPENDENT_AMBULATORY_CARE_PROVIDER_SITE_OTHER): Payer: Medicare HMO | Admitting: Family Medicine

## 2021-04-10 ENCOUNTER — Encounter: Payer: Self-pay | Admitting: Family Medicine

## 2021-04-10 ENCOUNTER — Other Ambulatory Visit: Payer: Self-pay

## 2021-04-10 VITALS — BP 148/94 | HR 78 | Temp 97.6°F | Wt 178.8 lb

## 2021-04-10 DIAGNOSIS — S0993XA Unspecified injury of face, initial encounter: Secondary | ICD-10-CM

## 2021-04-10 DIAGNOSIS — E785 Hyperlipidemia, unspecified: Secondary | ICD-10-CM

## 2021-04-10 DIAGNOSIS — S4991XA Unspecified injury of right shoulder and upper arm, initial encounter: Secondary | ICD-10-CM

## 2021-04-10 DIAGNOSIS — S60222A Contusion of left hand, initial encounter: Secondary | ICD-10-CM

## 2021-04-10 DIAGNOSIS — W19XXXA Unspecified fall, initial encounter: Secondary | ICD-10-CM | POA: Diagnosis not present

## 2021-04-10 MED ORDER — DICLOFENAC SODIUM 1 % EX GEL
2.0000 g | Freq: Four times a day (QID) | CUTANEOUS | 0 refills | Status: DC
Start: 2021-04-10 — End: 2022-01-11

## 2021-04-10 MED ORDER — ROSUVASTATIN CALCIUM 20 MG PO TABS: ORAL_TABLET | ORAL | 0 refills | Status: DC

## 2021-04-10 MED ORDER — IBUPROFEN 800 MG PO TABS: 800.0000 mg | ORAL_TABLET | Freq: Three times a day (TID) | ORAL | 0 refills | Status: DC | PRN

## 2021-04-10 NOTE — Progress Notes (Signed)
Subjective:    Patient ID: [name], female    DOB: 10-Jul-1951, 70 y.o.   MRN: [id number]  HPI Chief Complaint  Patient presents with   Fall   right shoulder pain   States she was walking home yesterday and tripped and fell from standing onto concrete.   Left hand is swollen and bruised. Right shoulder with difficulty moving it due to pain.  Left anterior chest wall soreness. Scratch on left forehead above her left eye. No concussion. Denies LOS, headache, dizziness, vision changes, N/V or memory changes.  Reports her main concern in her right shoulder. States it has improved since yesterday.   States she had an XR of her left hand yesterday which was negative per patient.   Denies numbness, tingling or weakness.  No neck or back pain.   Took 2 aspirin this morning.    Tdap in 2019.   Denies fever, chills, chest pain, palpitations, shortness of breath, abdominal pain, N/V/D, urinary symptoms, LE edema.   She works for a [employer], clinical and states she needs a light duty note until she is feeling better.     Review of Systems Pertinent positives and negatives in the history of present illness.     Objective:   Physical Exam Constitutional:      General: She is not in acute distress.    Appearance: She is not ill-appearing.  HENT:     Head: Normocephalic.      Comments: 2 small and superficial abrasions of left forehead. No sign of infection  Eyes:     Extraocular Movements: Extraocular movements intact.     Conjunctiva/sclera: Conjunctivae normal.     Pupils: Pupils are equal, round, and reactive to light.  Cardiovascular:     Rate and Rhythm: Normal rate and regular rhythm.     Pulses: Normal pulses.     Heart sounds: Normal heart sounds.  Pulmonary:     Effort: Pulmonary effort is normal.     Breath sounds: Normal breath sounds.  Musculoskeletal:     Right shoulder: No crepitus. Decreased range of motion. Normal strength. Normal pulse.     Left shoulder:  Normal.     Left hand: Swelling and tenderness present. Normal range of motion. Normal strength. Normal sensation. Normal capillary refill. Normal pulse.     Cervical back: Normal range of motion and neck supple.     Comments: Right shoulder with limited ROM due to pain. No obvious deformity but unable to do a full exam due to pain.  RUE is neurovascularly intact. Negative drop arm. Equal grip strength.  Left hand with hematoma and edema. Skin intact and no sign of infection. Normal sensation, ROM and strength.   Neurological:     Mental Status: She is alert.   BP (!) 148/94   Pulse 78   Temp 97.6 F (36.4 C)   Wt 178 lb 12.8 oz (81.1 kg)   LMP 02/20/1999 (Exact Date)   SpO2 97%   BMI 31.67 kg/m       Assessment & Plan:  Fall, initial encounter  Injury of right shoulder, initial encounter - Plan: ibuprofen (ADVIL) 800 MG tablet, diclofenac Sodium (VOLTAREN) 1 % GEL  Traumatic hematoma of left hand, initial encounter - Plan: ibuprofen (ADVIL) 800 MG tablet, diclofenac Sodium (VOLTAREN) 1 % GEL  Injury of forehead, initial encounter  She had a mechanical fall yesterday with injuries. No red flag symptoms and no sign of infection at this time.  Denies  concussion symptoms and no loss of consciousness. Unable to fully examine her right shoulder due to limited range of motion related to pain. 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  I do not have this report. She reports improvement in symptoms since yesterday. Tdap up-to-date Recommend conservative treatment including 80 mg of ibuprofen 3 times per day or 2 Aleve twice daily.  Ibuprofen 800 mg prescribed.  I also prescribed Voltaren gel for her to use as needed.  Recommend ice or heat to her right shoulder and left hand.  I will write her a note for work stating she will need light duty until she follows up here in approximately 1 week.  She will see Dr. Redmond School, her PCP, since I  will not be in the office next week.

## 2021-04-10 NOTE — Patient Instructions (Signed)
Take the ibuprofen 800 mg three times daily with food.  Use ice or heat.  You may also use the Voltaren gel as prescribed.   Follow up if worsening or next week with Dr. Redmond School.

## 2021-04-17 ENCOUNTER — Other Ambulatory Visit: Payer: Self-pay

## 2021-04-17 ENCOUNTER — Ambulatory Visit (INDEPENDENT_AMBULATORY_CARE_PROVIDER_SITE_OTHER): Payer: Medicare HMO | Admitting: Family Medicine

## 2021-04-17 VITALS — BP 136/72 | HR 83 | Temp 98.4°F | Resp 96 | Wt 180.4 lb

## 2021-04-17 DIAGNOSIS — S4991XA Unspecified injury of right shoulder and upper arm, initial encounter: Secondary | ICD-10-CM | POA: Diagnosis not present

## 2021-04-17 DIAGNOSIS — S60222A Contusion of left hand, initial encounter: Secondary | ICD-10-CM | POA: Diagnosis not present

## 2021-04-17 NOTE — Progress Notes (Signed)
   Subjective:    Patient ID: [name], female    DOB: 01-14-1951, 70 y.o.   MRN: [id number]  HPI She is here for recheck after an injury about a week ago when she sustained a hematoma to the left hand as well as right shoulder injury.  She says a hematoma is almost completely gone and her shoulder pain is much better only having difficulty with adduction and external rotation.   Review of Systems     Objective:   Physical Exam Left hand exam does show very small hematoma at the base of the fourth and fifth metacarpal of with minimal tenderness over that with good motion. Exam of the right shoulder shows full motion with slight tenderness of supraspinatus testing otherwise normal.       Assessment & Plan:  Injury of right shoulder, initial encounter  Traumatic hematoma of left hand, initial encounter At this point she is doing quite nicely and I see no need for further intervention.  She is comfortable with that.

## 2021-06-12 ENCOUNTER — Other Ambulatory Visit: Payer: Self-pay | Admitting: Family Medicine

## 2021-06-12 DIAGNOSIS — I10 Essential (primary) hypertension: Secondary | ICD-10-CM

## 2021-07-06 DIAGNOSIS — H25041 Posterior subcapsular polar age-related cataract, right eye: Secondary | ICD-10-CM | POA: Diagnosis not present

## 2021-07-06 DIAGNOSIS — H52202 Unspecified astigmatism, left eye: Secondary | ICD-10-CM | POA: Diagnosis not present

## 2021-07-06 DIAGNOSIS — H5213 Myopia, bilateral: Secondary | ICD-10-CM | POA: Diagnosis not present

## 2021-07-06 DIAGNOSIS — H524 Presbyopia: Secondary | ICD-10-CM | POA: Diagnosis not present

## 2021-07-11 ENCOUNTER — Other Ambulatory Visit: Payer: Self-pay | Admitting: Family Medicine

## 2021-07-11 DIAGNOSIS — E785 Hyperlipidemia, unspecified: Secondary | ICD-10-CM

## 2021-07-13 DIAGNOSIS — H25012 Cortical age-related cataract, left eye: Secondary | ICD-10-CM | POA: Diagnosis not present

## 2021-07-13 DIAGNOSIS — H25041 Posterior subcapsular polar age-related cataract, right eye: Secondary | ICD-10-CM | POA: Diagnosis not present

## 2021-07-13 DIAGNOSIS — H2513 Age-related nuclear cataract, bilateral: Secondary | ICD-10-CM | POA: Diagnosis not present

## 2021-09-18 DIAGNOSIS — H25811 Combined forms of age-related cataract, right eye: Secondary | ICD-10-CM | POA: Diagnosis not present

## 2021-09-18 DIAGNOSIS — H25041 Posterior subcapsular polar age-related cataract, right eye: Secondary | ICD-10-CM | POA: Diagnosis not present

## 2021-09-18 DIAGNOSIS — H2511 Age-related nuclear cataract, right eye: Secondary | ICD-10-CM | POA: Diagnosis not present

## 2021-10-10 ENCOUNTER — Other Ambulatory Visit: Payer: Self-pay | Admitting: Family Medicine

## 2021-10-10 DIAGNOSIS — E785 Hyperlipidemia, unspecified: Secondary | ICD-10-CM

## 2021-10-23 DIAGNOSIS — H25012 Cortical age-related cataract, left eye: Secondary | ICD-10-CM | POA: Diagnosis not present

## 2021-10-23 DIAGNOSIS — H2512 Age-related nuclear cataract, left eye: Secondary | ICD-10-CM | POA: Diagnosis not present

## 2021-10-23 DIAGNOSIS — H25812 Combined forms of age-related cataract, left eye: Secondary | ICD-10-CM | POA: Diagnosis not present

## 2021-11-28 DIAGNOSIS — Z961 Presence of intraocular lens: Secondary | ICD-10-CM | POA: Diagnosis not present

## 2021-12-07 ENCOUNTER — Other Ambulatory Visit: Payer: Self-pay | Admitting: Family Medicine

## 2021-12-07 DIAGNOSIS — I10 Essential (primary) hypertension: Secondary | ICD-10-CM

## 2022-01-08 ENCOUNTER — Other Ambulatory Visit: Payer: Self-pay | Admitting: Family Medicine

## 2022-01-08 DIAGNOSIS — E785 Hyperlipidemia, unspecified: Secondary | ICD-10-CM

## 2022-01-11 ENCOUNTER — Ambulatory Visit (INDEPENDENT_AMBULATORY_CARE_PROVIDER_SITE_OTHER): Payer: Medicare HMO | Admitting: Family Medicine

## 2022-01-11 ENCOUNTER — Other Ambulatory Visit: Payer: Self-pay

## 2022-01-11 VITALS — BP 122/70 | HR 84 | Ht 63.25 in | Wt 186.0 lb

## 2022-01-11 DIAGNOSIS — I10 Essential (primary) hypertension: Secondary | ICD-10-CM | POA: Diagnosis not present

## 2022-01-11 DIAGNOSIS — E119 Type 2 diabetes mellitus without complications: Secondary | ICD-10-CM

## 2022-01-11 DIAGNOSIS — Z6832 Body mass index (BMI) 32.0-32.9, adult: Secondary | ICD-10-CM

## 2022-01-11 DIAGNOSIS — E559 Vitamin D deficiency, unspecified: Secondary | ICD-10-CM | POA: Diagnosis not present

## 2022-01-11 DIAGNOSIS — R011 Cardiac murmur, unspecified: Secondary | ICD-10-CM | POA: Diagnosis not present

## 2022-01-11 DIAGNOSIS — Z0000 Encounter for general adult medical examination without abnormal findings: Secondary | ICD-10-CM

## 2022-01-11 DIAGNOSIS — Z9013 Acquired absence of bilateral breasts and nipples: Secondary | ICD-10-CM

## 2022-01-11 DIAGNOSIS — Z853 Personal history of malignant neoplasm of breast: Secondary | ICD-10-CM

## 2022-01-11 DIAGNOSIS — E6609 Other obesity due to excess calories: Secondary | ICD-10-CM

## 2022-01-11 DIAGNOSIS — E785 Hyperlipidemia, unspecified: Secondary | ICD-10-CM | POA: Diagnosis not present

## 2022-01-11 DIAGNOSIS — Z9849 Cataract extraction status, unspecified eye: Secondary | ICD-10-CM

## 2022-01-11 DIAGNOSIS — M8580 Other specified disorders of bone density and structure, unspecified site: Secondary | ICD-10-CM | POA: Diagnosis not present

## 2022-01-11 LAB — POCT GLYCOSYLATED HEMOGLOBIN (HGB A1C): Hemoglobin A1C: 6.7 % — AB (ref 4.0–5.6)

## 2022-01-11 LAB — COMPREHENSIVE METABOLIC PANEL: eGFR: 94

## 2022-01-11 MED ORDER — LOSARTAN POTASSIUM 50 MG PO TABS: 50.0000 mg | ORAL_TABLET | Freq: Every day | ORAL | 3 refills | Status: DC

## 2022-01-11 MED ORDER — ROSUVASTATIN CALCIUM 20 MG PO TABS: 20.0000 mg | ORAL_TABLET | Freq: Every day | ORAL | 3 refills | Status: DC

## 2022-01-11 NOTE — Patient Instructions (Signed)
?  [name] , ?Thank you for taking time to come for your Medicare Wellness Visit. I appreciate your ongoing commitment to your health goals. Please review the following plan we discussed and let me know if I can assist you in the future.  ? ?These are the goals we discussed: ?Discussed the new onset of diabetes with her in terms of diet, exercise, medications.  She is to return here in 1 month for consultation concerning this.  Also recommend going to the American diabetes Association website. ?  ?This is a list of the screening recommended for you and due dates:  ?Health Maintenance  ?Topic Date Due  ? COVID-19 Vaccine (5 - Booster for Moderna series) 07/27/2021  ? Cologuard (Stool DNA test)  10/16/2023  ? Tetanus Vaccine  09/01/2028  ? Pneumonia Vaccine  Completed  ? Flu Shot  Completed  ? DEXA scan (bone density measurement)  Completed  ? Hepatitis C Screening: USPSTF Recommendation to screen - Ages 18-79 yo.  Completed  ? Zoster (Shingles) Vaccine  Completed  ? HPV Vaccine  Aged Out  ? Mammogram  Discontinued  ?  ?

## 2022-01-11 NOTE — Progress Notes (Signed)
[name] is a 71 y.o. female who presents for annual wellness visit CPE and follow-up on chronic medical conditions.  She has no particular concerns or complaints.  She does have a previous history of breast cancer in 2005 and has routine follow-up for that.  She does have a history of glucose intolerance.  She also has hyperlipidemia and presently is on Crestor for that.  She is also taking losartan for her blood pressure.  She does have a history of vitamin D deficiency and has been taking vitamin D supplementation.  She also has a history of osteopenia.  She has had cataract surgery.  She is a semiretired working at [employer] and enjoying that.  She does not smoke or drink.  Her physical activity revolves around working. ? ?Immunizations and Health Maintenance ?Immunization History  ?Administered Date(s) Administered  ? Influenza Split 07/27/2012, 07/29/2013, 08/10/2015  ? Influenza Whole 07/16/2010, 08/05/2011  ? Influenza, High Dose Seasonal PF 08/16/2018, 08/20/2019  ? Influenza,inj,Quad PF,6+ Mos 08/12/2017  ? Influenza-Unspecified 08/25/2016, 08/20/2019, 06/24/2020, 07/27/2021  ? Moderna Sars-Covid-2 Vaccination 12/03/2019, 12/31/2019, 09/01/2020  ? PFIZER Comirnaty(Gray Top)Covid-19 Tri-Sucrose Vaccine 06/01/2021  ? Pneumococcal Conjugate-13 09/01/2018  ? Pneumococcal Polysaccharide-23 09/23/2019  ? Rabies, IM 07/15/2012, 07/18/2012  ? Tdap 10/21/2008, 09/01/2018  ? Zoster Recombinat (Shingrix) 02/07/2017, 08/12/2017  ? Zoster, Live 09/08/2013  ? ?Health Maintenance Due  ?Topic Date Due  ? COVID-19 Vaccine (5 - Booster for Moderna series) 07/27/2021  ? ? ?Last Pap smear:  no pap in the last 3-5 years ?Last mammogram: no mammogram ?Last colonoscopy:  cologuard 2021 ?Last DEXA: due this year ?Dentist: Dr. Sheran Luz ?[facility] ?Exercise: walking ? ?Other doctors caring for patient include:Dr Glyndon ? ? ?Advanced directives: yes  ?Ron Wynetta Emery- POA ?  ? ?Depression screen:  See  questionnaire below.  ? ?  01/11/2022  ?  1:24 PM 10/06/2020  ?  8:36 AM 09/23/2019  ?  8:25 AM 09/01/2018  ?  9:27 AM 10/10/2017  ? 10:20 AM  ?Depression screen PHQ 2/9  ?Decreased Interest 0 0 0 0 0  ?Down, Depressed, Hopeless 0 0 0 0 0  ?PHQ - 2 Score 0 0 0 0 0  ? ? ?Fall Risk Screen: see questionnaire below. ? ?  01/11/2022  ?  1:24 PM 10/06/2020  ?  8:35 AM 09/23/2019  ?  8:25 AM 08/29/2016  ? 10:36 AM  ?Fall Risk   ?Falls in the past year? 1 0 0 No  ?Number falls in past yr: 0     ?Injury with Fall? 1     ?Risk for fall due to : Impaired balance/gait No Fall Risks    ?Follow up Falls evaluation completed     ? ? ?ADL screen:  See questionnaire below ?Functional Status Survey: ?Is the patient deaf or have difficulty hearing?: No ?Does the patient have difficulty seeing, even when wearing glasses/contacts?: No ?Does the patient have difficulty concentrating, remembering, or making decisions?: No ?Does the patient have difficulty walking or climbing stairs?: No ?Does the patient have difficulty dressing or bathing?: No ?Does the patient have difficulty doing errands alone such as visiting a doctor's office or shopping?: No ? ? ?Review of Systems ?Constitutional: -, -unexpected weight change, -anorexia, -fatigue ?Allergy: -sneezing, -itching, -congestion ?Dermatology: denies changing moles, rash, lumps ?ENT: -runny nose, -ear pain, -sore throat,  ?Cardiology:  -chest pain, -palpitations, -orthopnea, ?Respiratory: -cough, -shortness of breath, -dyspnea on exertion, -wheezing,  ?Gastroenterology: -abdominal pain, -nausea, -vomiting, -diarrhea, -  constipation, -dysphagia ?Hematology: -bleeding or bruising problems ?Musculoskeletal: -arthralgias, -myalgias, -joint swelling, -back pain, - ?Ophthalmology: -vision changes,  ?Urology: -dysuria, -difficulty urinating,  -urinary frequency, -urgency, incontinence ?Neurology: -, -numbness, , -memory loss, -falls, -dizziness ? ? ? ?PHYSICAL EXAM: ? ?LMP 02/20/1999 (Exact Date)   ? ?General Appearance: Alert, cooperative, no distress, appears stated age ?Head: Normocephalic, without obvious abnormality, atraumatic ?Eyes: PERRL, conjunctiva/corneas clear, EOM's intact, ?Ears: Normal TM's and external ear canals ?Nose: Nares normal, mucosa normal, no drainage or sinus tenderness ?Throat: Lips, mucosa, and tongue normal; teeth and gums normal ?Neck: Supple, no lymphadenopathy;  thyroid:  no enlargement/tenderness/nodules; no carotid bruit or JVD ?Lungs: Clear to auscultation bilaterally without wheezes, rales or ronchi; respirations unlabored ?Heart: Regular rate and rhythm, S1 and S2 normal, systolic murmur heard best along the left sternal border.   ?Abdomen: Soft, non-tender, nondistended, normoactive bowel sounds,  ?no masses, no hepatosplenomegaly ?Extremities: No clubbing, cyanosis or edema ? ?Skin:  Skin color, texture, turgor normal, no rashes or lesions ?Lymph nodes: Cervical, supraclavicular, and axillary nodes normal ?Neurologic:  CNII-XII intact, normal strength, sensation and gait; reflexes 2+ and symmetric throughout ?Psych: Normal mood, affect, hygiene and grooming. ?Hemoglobin A1c is 6.7 ?ASSESSMENT/PLAN: ?Routine general medical examination at a health care facility ? ?History of cataract extraction, unspecified laterality ? ?Essential hypertension - Plan: losartan (COZAAR) 50 MG tablet, CBC with Differential/Platelet, Comprehensive metabolic panel ? ?Hyperlipidemia with target LDL less than 100 - Plan: rosuvastatin (CRESTOR) 20 MG tablet, Lipid panel ? ?Vitamin D deficiency ? ?History of breast cancer in female ? ?Osteopenia, unspecified location - Plan: DG Bone Density ? ?H/O bilateral mastectomy ? ?Class 1 obesity due to excess calories without serious comorbidity with body mass index (BMI) of 32.0 to 32.9 in adult ? ?New onset type 2 diabetes mellitus ([place]) - Plan: HgB A1c ? ?Murmur - Plan: ECHOCARDIOGRAM COMPLETE, EKG 12-Lead ?I explained that we will need to follow-up on  the murmur with an echocardiogram and possible cardiology referral. ?She will continue on her present medication regimen. ?I discussed the new onset of diabetes in regard to diet, exercise, medications,, risk for heart disease, renal disease..  Explained that at this point would not need to place her on a diabetes medication.  Recommend that she go to the American diabetes Association website to get more information about this.  Return here in 1 month for further consultation concerning diabetes in regard to foot care, eye care. ? ? ? ?Discussed at least 30 minutes of aerobic activity at least 5 days/week and weight-bearing exercise 2x/week; healthy diet, including goals of calcium and vitamin D intake a  Immunization recommendations discussed.  Colonoscopy recommendations reviewed ? ? ?Medicare Attestation ?I have personally reviewed: ?The patient's medical and social history ?Their use of alcohol, tobacco or illicit drugs ?Their current medications and supplements ?The patient's functional ability including ADLs,fall risks, home safety risks, cognitive, and hearing and visual impairment ?Diet and physical activities ?Evidence for depression or mood disorders ? ?The patient's weight, height, and BMI have been recorded in the chart.  I have made referrals, counseling, and provided education to the patient based on review of the above and I have provided the patient with a written personalized care plan for preventive services.   ? ? ?Jill Alexanders, MD   01/11/2022  ?

## 2022-01-12 LAB — CBC WITH DIFFERENTIAL/PLATELET
Basophils Absolute: 0.1 10*3/uL (ref 0.0–0.2)
Basos: 1 %
EOS (ABSOLUTE): 0.2 10*3/uL (ref 0.0–0.4)
Eos: 2 %
Hematocrit: 42.6 % (ref 34.0–46.6)
Hemoglobin: 14 g/dL (ref 11.1–15.9)
Immature Grans (Abs): 0 10*3/uL (ref 0.0–0.1)
Immature Granulocytes: 0 %
Lymphocytes Absolute: 2.6 10*3/uL (ref 0.7–3.1)
Lymphs: 39 %
MCH: 29.7 pg (ref 26.6–33.0)
MCHC: 32.9 g/dL (ref 31.5–35.7)
MCV: 90 fL (ref 79–97)
Monocytes Absolute: 0.6 10*3/uL (ref 0.1–0.9)
Monocytes: 9 %
Neutrophils Absolute: 3.3 10*3/uL (ref 1.4–7.0)
Neutrophils: 49 %
Platelets: 290 10*3/uL (ref 150–450)
RBC: 4.72 x10E6/uL (ref 3.77–5.28)
RDW: 13.3 % (ref 11.7–15.4)
WBC: 6.6 10*3/uL (ref 3.4–10.8)

## 2022-01-12 LAB — COMPREHENSIVE METABOLIC PANEL
ALT: 22 IU/L (ref 0–32)
AST: 22 IU/L (ref 0–40)
Albumin/Globulin Ratio: 1.8 (ref 1.2–2.2)
Albumin: 4.6 g/dL (ref 3.8–4.8)
Alkaline Phosphatase: 80 IU/L (ref 44–121)
BUN/Creatinine Ratio: 19 (ref 12–28)
BUN: 13 mg/dL (ref 8–27)
Bilirubin Total: 0.4 mg/dL (ref 0.0–1.2)
CO2: 26 mmol/L (ref 20–29)
Calcium: 9.4 mg/dL (ref 8.7–10.3)
Chloride: 103 mmol/L (ref 96–106)
Creatinine, Ser: 0.68 mg/dL (ref 0.57–1.00)
Globulin, Total: 2.5 g/dL (ref 1.5–4.5)
Glucose: 125 mg/dL — ABNORMAL HIGH (ref 70–99)
Potassium: 4.8 mmol/L (ref 3.5–5.2)
Sodium: 143 mmol/L (ref 134–144)
Total Protein: 7.1 g/dL (ref 6.0–8.5)
eGFR: 94 mL/min/{1.73_m2} (ref >59)

## 2022-01-12 LAB — LIPID PANEL
Chol/HDL Ratio: 4.6 ratio — ABNORMAL HIGH (ref 0.0–4.4)
Cholesterol, Total: 174 mg/dL (ref 100–199)
HDL: 38 mg/dL — ABNORMAL LOW (ref >39)
LDL Chol Calc (NIH): 94 mg/dL (ref 0–99)
Triglycerides: 249 mg/dL — ABNORMAL HIGH (ref 0–149)
VLDL Cholesterol Cal: 42 mg/dL — ABNORMAL HIGH (ref 5–40)

## 2022-01-25 ENCOUNTER — Ambulatory Visit (HOSPITAL_COMMUNITY): Payer: Medicare HMO | Attending: Cardiology

## 2022-01-25 DIAGNOSIS — R011 Cardiac murmur, unspecified: Secondary | ICD-10-CM | POA: Diagnosis not present

## 2022-01-25 LAB — ECHOCARDIOGRAM COMPLETE
Area-P 1/2: 3.91 cm2
S' Lateral: 2.6 cm

## 2022-02-15 ENCOUNTER — Ambulatory Visit (INDEPENDENT_AMBULATORY_CARE_PROVIDER_SITE_OTHER): Payer: Medicare HMO | Admitting: Family Medicine

## 2022-02-15 ENCOUNTER — Encounter: Payer: Self-pay | Admitting: Family Medicine

## 2022-02-15 VITALS — BP 120/72 | HR 72 | Temp 98.1°F | Wt 184.0 lb

## 2022-02-15 DIAGNOSIS — Z8249 Family history of ischemic heart disease and other diseases of the circulatory system: Secondary | ICD-10-CM

## 2022-02-15 DIAGNOSIS — E119 Type 2 diabetes mellitus without complications: Secondary | ICD-10-CM

## 2022-02-15 MED ORDER — ACCU-CHEK SOFTCLIX LANCETS MISC: 12 refills | Status: DC

## 2022-02-15 MED ORDER — BLOOD GLUCOSE TEST VI STRP: 1.0000 | ORAL_STRIP | Freq: Two times a day (BID) | 1 refills | Status: DC

## 2022-02-15 NOTE — Progress Notes (Signed)
? ?  Subjective:  ? ? Patient ID: [name], female    DOB: 1951-01-18, 71 y.o.   MRN: [id number] ? ?HPI ?She is here for recheck on her diabetes.  Her initial hemoglobin A1c is 6.7.  Also of note is the fact that she has a sister that has had a heart attack. ? ? ?Review of Systems ? ?   ?Objective:  ? Physical Exam ?Alert and in no distress otherwise not examined ? ? ? ?   ?Assessment & Plan:  ?New onset type 2 diabetes mellitus ([place]) ? ?Family history of heart disease ?The diagnosis of diabetes was discussed with her in terms of diet, exercise, medications, checking blood sugars, foot care, eye care.  Presently she is on no diabetes medicines but is taking blood pressure as well as cholesterol medications.  She has also got online and checked concerning diabetes with American diabetes Association.  All of her questions were answered.  She is to return here in several months for recheck.  She will call if she has further difficulties. ? ?

## 2022-05-10 ENCOUNTER — Ambulatory Visit: Payer: Medicare HMO | Admitting: Family Medicine

## 2022-05-31 ENCOUNTER — Ambulatory Visit (INDEPENDENT_AMBULATORY_CARE_PROVIDER_SITE_OTHER): Payer: Medicare HMO | Admitting: Family Medicine

## 2022-05-31 ENCOUNTER — Encounter: Payer: Self-pay | Admitting: Family Medicine

## 2022-05-31 VITALS — BP 130/70 | HR 71 | Temp 98.4°F | Wt 178.8 lb

## 2022-05-31 DIAGNOSIS — E119 Type 2 diabetes mellitus without complications: Secondary | ICD-10-CM

## 2022-05-31 DIAGNOSIS — Z9849 Cataract extraction status, unspecified eye: Secondary | ICD-10-CM

## 2022-05-31 DIAGNOSIS — E785 Hyperlipidemia, unspecified: Secondary | ICD-10-CM

## 2022-05-31 DIAGNOSIS — I10 Essential (primary) hypertension: Secondary | ICD-10-CM

## 2022-05-31 DIAGNOSIS — M8580 Other specified disorders of bone density and structure, unspecified site: Secondary | ICD-10-CM | POA: Diagnosis not present

## 2022-05-31 LAB — POCT GLYCOSYLATED HEMOGLOBIN (HGB A1C): Hemoglobin A1C: 7.1 % — AB (ref 4.0–5.6)

## 2022-05-31 NOTE — Progress Notes (Signed)
Subjective:    Patient ID: [name], female    DOB: 1951/01/01, 71 y.o.   MRN: [id number]  [name] is a 71 y.o. female who presents for follow-up of Type 2 diabetes mellitus.  Patient is checking home blood sugars.   Home blood sugar records: BGs have been labile ranging between 97 and 170 How often is blood sugars being checked: BID  Current symptoms/problems include none and have been stable. Daily foot checks: yes   Any foot concerns: none at this time  Last eye exam: 10/2021 Exercise:  staying active   QD She continues on losartan as well as rosuvastatin and multivitamins.  She is presently not taking any diabetes related medications.  She does keep her self physically active.  She has had cataract surgery.  She also has a history of osteopenia and is scheduled for DEXA scan later this year. The following portions of the patient's history were reviewed and updated as appropriate: allergies, current medications, past medical history, past social history and problem list.  ROS as in subjective above.     Objective:    Physical Exam Alert and in no distress otherwise not examined. Hemoglobin A1c is 7.1 Blood pressure 130/70, pulse 71, temperature 98.4 F (36.9 C), weight 178 lb 12.8 oz (81.1 kg), last menstrual period 02/20/1999, SpO2 95 %.  Lab Review    Latest Ref Rng & Units 01/11/2022    2:38 PM 01/11/2022    2:12 PM 10/06/2020   12:27 PM 10/06/2020    9:40 AM 09/23/2019    9:16 AM  Diabetic Labs  HbA1c 4.0 - 5.6 % 6.7   6.2     Chol 100 - 199 mg/dL  174   197  155   HDL >39 mg/dL  38   38  42   Calc LDL 0 - 99 mg/dL  94   109  79   Triglycerides 0 - 149 mg/dL  249   290  204   Creatinine 0.57 - 1.00 mg/dL  0.68   0.67  0.68       05/31/2022    9:56 AM 02/15/2022   11:54 AM 01/11/2022    1:28 PM 04/17/2021    2:23 PM 04/10/2021   10:30 AM  BP/Weight  Systolic BP 175 102 585 277 824  Diastolic BP 70 72 70 72 94  Wt. (Lbs) 178.8 184 186 180.4  178.8  BMI 31.42 kg/m2 32.34 kg/m2 32.69 kg/m2 31.96 kg/m2 31.67 kg/m2       No data to display          [name]  reports that she quit smoking about 44 years ago. Her smoking use included cigarettes. She has a 25.00 pack-year smoking history. She has never used smokeless tobacco. She reports that she does not drink alcohol and does not use drugs.     Assessment & Plan:    New onset type 2 diabetes mellitus ([place]) - Plan: POCT glycosylated hemoglobin (Hb A1C)  History of cataract extraction, unspecified laterality  Essential hypertension  Hyperlipidemia with target LDL less than 100  Osteopenia, unspecified location  Rx changes: none Education: Reviewed 'ABCs' of diabetes management (respective goals in parentheses):  A1C (<7), blood pressure (<130/80), and cholesterol (LDL <100). Compliance at present is estimated to be good. Efforts to improve compliance (if necessary) will be directed at increased exercise. Follow up: 6 months Discussed the potential use of diabetes medications mentioning SGLT2 versus GLP-1 in the future.  At this  point she is not interested.

## 2022-05-31 NOTE — Patient Instructions (Signed)

## 2022-06-26 ENCOUNTER — Encounter: Payer: Self-pay | Admitting: Internal Medicine

## 2022-06-27 ENCOUNTER — Encounter: Payer: Self-pay | Admitting: Family Medicine

## 2022-07-05 ENCOUNTER — Ambulatory Visit
Admission: RE | Admit: 2022-07-05 | Discharge: 2022-07-05 | Disposition: A | Discharge status: Home / Self Care | Payer: Medicare HMO | Source: Ambulatory Visit | Attending: Family Medicine | Admitting: Family Medicine

## 2022-07-05 DIAGNOSIS — M85832 Other specified disorders of bone density and structure, left forearm: Secondary | ICD-10-CM | POA: Diagnosis not present

## 2022-07-05 DIAGNOSIS — Z780 Asymptomatic menopausal state: Secondary | ICD-10-CM | POA: Diagnosis not present

## 2022-07-30 ENCOUNTER — Encounter: Payer: Self-pay | Admitting: Internal Medicine

## 2022-08-12 ENCOUNTER — Encounter: Payer: Self-pay | Admitting: Internal Medicine

## 2022-09-10 ENCOUNTER — Other Ambulatory Visit: Payer: Self-pay | Admitting: Family Medicine

## 2022-09-10 DIAGNOSIS — E119 Type 2 diabetes mellitus without complications: Secondary | ICD-10-CM

## 2022-11-28 ENCOUNTER — Ambulatory Visit (INDEPENDENT_AMBULATORY_CARE_PROVIDER_SITE_OTHER): Payer: Medicare HMO | Admitting: Family Medicine

## 2022-11-28 ENCOUNTER — Encounter: Payer: Self-pay | Admitting: Family Medicine

## 2022-11-28 VITALS — BP 132/70 | HR 78 | Temp 98.3°F | Resp 16 | Wt 181.8 lb

## 2022-11-28 DIAGNOSIS — M8580 Other specified disorders of bone density and structure, unspecified site: Secondary | ICD-10-CM | POA: Diagnosis not present

## 2022-11-28 DIAGNOSIS — Z1321 Encounter for screening for nutritional disorder: Secondary | ICD-10-CM

## 2022-11-28 DIAGNOSIS — I152 Hypertension secondary to endocrine disorders: Secondary | ICD-10-CM

## 2022-11-28 DIAGNOSIS — E785 Hyperlipidemia, unspecified: Secondary | ICD-10-CM | POA: Diagnosis not present

## 2022-11-28 DIAGNOSIS — E559 Vitamin D deficiency, unspecified: Secondary | ICD-10-CM | POA: Diagnosis not present

## 2022-11-28 DIAGNOSIS — E1169 Type 2 diabetes mellitus with other specified complication: Secondary | ICD-10-CM

## 2022-11-28 DIAGNOSIS — E1159 Type 2 diabetes mellitus with other circulatory complications: Secondary | ICD-10-CM

## 2022-11-28 DIAGNOSIS — Z23 Encounter for immunization: Secondary | ICD-10-CM

## 2022-11-28 LAB — POCT UA - MICROALBUMIN
Albumin/Creatinine Ratio, Urine, POC: 6
Creatinine, POC: 184.8 mg/dL
Microalbumin Ur, POC: 11.1 mg/L

## 2022-11-28 LAB — LIPID PANEL

## 2022-11-28 LAB — POCT GLYCOSYLATED HEMOGLOBIN (HGB A1C): Hemoglobin A1C: 7.5 % — AB (ref 4.0–5.6)

## 2022-11-28 MED ORDER — METFORMIN HCL 500 MG PO TABS: 500.0000 mg | ORAL_TABLET | Freq: Two times a day (BID) | ORAL | 1 refills | Status: DC

## 2022-11-28 NOTE — Progress Notes (Signed)
Subjective:    Patient ID: [name], female    DOB: 1951-02-04, 72 y.o.   MRN: [id number]  [name] is a 72 y.o. female who presents for follow-up of Type 2 diabetes mellitus.  Home blood sugar records: fasting range: 120-130 Current symptoms/problems include none and have been unchanged. Daily foot checks: yes   Any foot concerns: none How often blood sugars checked: every other day Exercise: Home exercise routine includes walking daily 15 minutes to work. Diet: well balanced She does check her blood sugars several times per week.  She continues on losartan and rosuvastatin and is having no difficulty with that.  She is also taking vitamin D and calcium supplementation. The following portions of the patient's history were reviewed and updated as appropriate: allergies, current medications, past medical history, past social history and problem list.  ROS as in subjective above.     Objective:    Physical Exam Alert and in no distress diabetic foot exam is normal.  Hemoglobin A1c is 7.5  Blood pressure 132/70, pulse 78, temperature 98.3 F (36.8 C), temperature source Oral, resp. rate 16, weight 181 lb 12.8 oz (82.5 kg), last menstrual period 02/20/1999, SpO2 96 %.  Lab Review    Latest Ref Rng & Units 11/28/2022   12:14 PM 11/28/2022   11:59 AM 05/31/2022   11:00 AM 01/11/2022    2:38 PM 01/11/2022    2:12 PM  Diabetic Labs  HbA1c 4.0 - 5.6 %  7.5  7.1  6.7    Microalbumin mg/L 11.1       Micro/Creat Ratio  6.0       Chol 100 - 199 mg/dL     174   HDL >39 mg/dL     38   Calc LDL 0 - 99 mg/dL     94   Triglycerides 0 - 149 mg/dL     249   Creatinine 0.57 - 1.00 mg/dL     0.68       11/28/2022   11:44 AM 05/31/2022    9:56 AM 02/15/2022   11:54 AM 01/11/2022    1:28 PM 04/17/2021    2:23 PM  BP/Weight  Systolic BP 626 948 546 270 350  Diastolic BP 70 70 72 70 72  Wt. (Lbs) 181.8 178.8 184 186 180.4  BMI 31.95 kg/m2 31.42 kg/m2 32.34 kg/m2 32.69 kg/m2  31.96 kg/m2      11/28/2022   11:45 AM  Foot/eye exam completion dates  Foot Form Completion Done    [name]  reports that she quit smoking about 45 years ago. Her smoking use included cigarettes. She has a 25.00 pack-year smoking history. She has never used smokeless tobacco. She reports that she does not drink alcohol and does not use drugs.     Assessment & Plan:    Type 2 diabetes mellitus with other specified complication, without long-term current use of insulin ([facility]) - Plan: POCT glycosylated hemoglobin (Hb A1C), CBC with Differential/Platelet, Comprehensive metabolic panel, POCT UA - Microalbumin, metFORMIN (GLUCOPHAGE) 500 MG tablet  Hypertension associated with diabetes ([place]) - Plan: CBC with Differential/Platelet, Comprehensive metabolic panel  Hyperlipidemia associated with type 2 diabetes mellitus ([place]) - Plan: Lipid panel  Osteopenia, unspecified location  Encounter for vitamin deficiency screening - Plan: VITAMIN D 25 Hydroxy (Vit-D Deficiency, Fractures) I will add metformin to her regimen.  Discussed GLP-1 however the expense at this point is not worth it.  Did recommend she get RSV shot.  Encouraged  physical activity and cutting back on carbohydrates.  Continue on present medication regimen.

## 2022-11-29 ENCOUNTER — Ambulatory Visit: Payer: Medicare HMO | Admitting: Family Medicine

## 2022-11-29 LAB — CBC WITH DIFFERENTIAL/PLATELET
Basophils Absolute: 0 10*3/uL (ref 0.0–0.2)
Basos: 0 %
EOS (ABSOLUTE): 0.1 10*3/uL (ref 0.0–0.4)
Eos: 1 %
Hematocrit: 41.7 % (ref 34.0–46.6)
Hemoglobin: 13.5 g/dL (ref 11.1–15.9)
Immature Grans (Abs): 0 10*3/uL (ref 0.0–0.1)
Immature Granulocytes: 0 %
Lymphocytes Absolute: 2.5 10*3/uL (ref 0.7–3.1)
Lymphs: 31 %
MCH: 29.3 pg (ref 26.6–33.0)
MCHC: 32.4 g/dL (ref 31.5–35.7)
MCV: 91 fL (ref 79–97)
Monocytes Absolute: 0.6 10*3/uL (ref 0.1–0.9)
Monocytes: 8 %
Neutrophils Absolute: 4.7 10*3/uL (ref 1.4–7.0)
Neutrophils: 60 %
Platelets: 291 10*3/uL (ref 150–450)
RBC: 4.6 x10E6/uL (ref 3.77–5.28)
RDW: 13.4 % (ref 11.7–15.4)
WBC: 8 10*3/uL (ref 3.4–10.8)

## 2022-11-29 LAB — COMPREHENSIVE METABOLIC PANEL
ALT: 18 IU/L (ref 0–32)
AST: 17 IU/L (ref 0–40)
Albumin/Globulin Ratio: 2 (ref 1.2–2.2)
Albumin: 4.7 g/dL (ref 3.9–4.9)
Alkaline Phosphatase: 80 IU/L (ref 44–121)
BUN/Creatinine Ratio: 23 (ref 12–28)
BUN: 16 mg/dL (ref 8–27)
Bilirubin Total: 0.6 mg/dL (ref 0.0–1.2)
CO2: 20 mmol/L (ref 20–29)
Calcium: 9.3 mg/dL (ref 8.7–10.3)
Chloride: 101 mmol/L (ref 96–106)
Creatinine, Ser: 0.7 mg/dL (ref 0.57–1.00)
Globulin, Total: 2.4 g/dL (ref 1.5–4.5)
Glucose: 101 mg/dL — ABNORMAL HIGH (ref 70–99)
Potassium: 3.9 mmol/L (ref 3.5–5.2)
Sodium: 141 mmol/L (ref 134–144)
Total Protein: 7.1 g/dL (ref 6.0–8.5)
eGFR: 93 mL/min/{1.73_m2} (ref >59)

## 2022-11-29 LAB — LIPID PANEL
Chol/HDL Ratio: 4.9 ratio — ABNORMAL HIGH (ref 0.0–4.4)
Cholesterol, Total: 188 mg/dL (ref 100–199)
HDL: 38 mg/dL — ABNORMAL LOW (ref >39)
LDL Chol Calc (NIH): 96 mg/dL (ref 0–99)
Triglycerides: 320 mg/dL — ABNORMAL HIGH (ref 0–149)
VLDL Cholesterol Cal: 54 mg/dL — ABNORMAL HIGH (ref 5–40)

## 2022-11-29 LAB — VITAMIN D 25 HYDROXY (VIT D DEFICIENCY, FRACTURES): Vit D, 25-Hydroxy: 28.1 ng/mL — ABNORMAL LOW (ref 30.0–100.0)

## 2022-12-04 ENCOUNTER — Telehealth: Payer: Self-pay

## 2022-12-04 DIAGNOSIS — H43813 Vitreous degeneration, bilateral: Secondary | ICD-10-CM | POA: Diagnosis not present

## 2022-12-04 DIAGNOSIS — E119 Type 2 diabetes mellitus without complications: Secondary | ICD-10-CM | POA: Diagnosis not present

## 2022-12-04 DIAGNOSIS — Z961 Presence of intraocular lens: Secondary | ICD-10-CM | POA: Diagnosis not present

## 2022-12-04 DIAGNOSIS — I10 Essential (primary) hypertension: Secondary | ICD-10-CM

## 2022-12-04 MED ORDER — LOSARTAN POTASSIUM 50 MG PO TABS: 50.0000 mg | ORAL_TABLET | Freq: Every day | ORAL | 1 refills | Status: DC

## 2022-12-04 NOTE — Telephone Encounter (Signed)
Pt called and is requesting a refill on he losartan [place], [place] AT [facility]

## 2023-01-09 ENCOUNTER — Other Ambulatory Visit: Payer: Self-pay | Admitting: Family Medicine

## 2023-01-09 DIAGNOSIS — E785 Hyperlipidemia, unspecified: Secondary | ICD-10-CM

## 2023-01-31 ENCOUNTER — Ambulatory Visit: Payer: Medicare HMO | Admitting: Family Medicine

## 2023-02-13 ENCOUNTER — Ambulatory Visit: Payer: Medicare HMO | Admitting: Family Medicine

## 2023-03-01 ENCOUNTER — Other Ambulatory Visit: Payer: Self-pay | Admitting: Family Medicine

## 2023-03-01 DIAGNOSIS — E119 Type 2 diabetes mellitus without complications: Secondary | ICD-10-CM

## 2023-05-08 ENCOUNTER — Ambulatory Visit: Payer: Medicare HMO | Admitting: Family Medicine

## 2023-05-08 ENCOUNTER — Encounter: Payer: Self-pay | Admitting: Family Medicine

## 2023-05-08 VITALS — BP 138/70 | HR 77 | Temp 98.1°F | Resp 18 | Ht 63.25 in | Wt 180.0 lb

## 2023-05-08 DIAGNOSIS — I10 Essential (primary) hypertension: Secondary | ICD-10-CM

## 2023-05-08 DIAGNOSIS — Z9013 Acquired absence of bilateral breasts and nipples: Secondary | ICD-10-CM

## 2023-05-08 DIAGNOSIS — E559 Vitamin D deficiency, unspecified: Secondary | ICD-10-CM | POA: Diagnosis not present

## 2023-05-08 DIAGNOSIS — Z853 Personal history of malignant neoplasm of breast: Secondary | ICD-10-CM

## 2023-05-08 DIAGNOSIS — Z0000 Encounter for general adult medical examination without abnormal findings: Secondary | ICD-10-CM | POA: Diagnosis not present

## 2023-05-08 DIAGNOSIS — E7439 Other disorders of intestinal carbohydrate absorption: Secondary | ICD-10-CM

## 2023-05-08 DIAGNOSIS — E1169 Type 2 diabetes mellitus with other specified complication: Secondary | ICD-10-CM

## 2023-05-08 DIAGNOSIS — E785 Hyperlipidemia, unspecified: Secondary | ICD-10-CM | POA: Diagnosis not present

## 2023-05-08 DIAGNOSIS — Z131 Encounter for screening for diabetes mellitus: Secondary | ICD-10-CM | POA: Diagnosis not present

## 2023-05-08 DIAGNOSIS — M8580 Other specified disorders of bone density and structure, unspecified site: Secondary | ICD-10-CM | POA: Diagnosis not present

## 2023-05-08 LAB — POCT GLYCOSYLATED HEMOGLOBIN (HGB A1C): Hemoglobin A1C: 6.8 % — AB (ref 4.0–5.6)

## 2023-05-08 NOTE — Progress Notes (Signed)
[name] is a 72 y.o. female who presents for annual wellness visit and follow-up on chronic medical conditions.  She has no particular concerns or complaints.  She continues on metformin, losartan, rosuvastatin and is having no difficulty with them.  She is also taking vitamin D.  She has had some left shoulder discomfort but at this time is not interested in pursuing further evaluation and treatment.  Work is going well.  Blood work in February did show an elevated triglyceride.  She was given a [place] cruise as a gift from where she works.  Immunizations and Health Maintenance Immunization History  Administered Date(s) Administered   Influenza Split 07/27/2012, 07/29/2013, 08/10/2015   Influenza Whole 07/16/2010, 08/05/2011   Influenza, High Dose Seasonal PF 08/16/2018, 08/20/2019   Influenza,inj,Quad PF,6+ Mos 08/12/2017   Influenza-Unspecified 08/25/2016, 08/20/2019, 06/24/2020, 07/27/2021, 07/12/2022   Moderna Sars-Covid-2 Vaccination 12/03/2019, 12/31/2019, 09/01/2020   PFIZER Comirnaty(Gray Top)Covid-19 Tri-Sucrose Vaccine 06/01/2021   Pfizer Covid-19 Vaccine Bivalent Booster 41yrs & up 11/02/2021   Pneumococcal Conjugate-13 09/01/2018   Pneumococcal Polysaccharide-23 09/23/2019   Rabies, IM 07/15/2012, 07/18/2012   Tdap 10/21/2008, 09/01/2018   Unspecified SARS-COV-2 Vaccination 08/02/2022   Zoster Recombinant(Shingrix) 02/07/2017, 08/12/2017   Zoster, Live 09/08/2013   Health Maintenance Due  Topic Date Due   COVID-19 Vaccine (7 - 2023-24 season) 09/27/2022    Last Pap smear: n/a Last mammogram: n/a bilateral mastectomy Last colonoscopy: Cologuard 2021  Last DEXA:  06/2022 Dentist:within the last year Ophtho: within the last year Exercise: walking daily  Other doctors caring for patient include: Dr. Emily [facility]  Advanced directives: Yes. Not on file.  Encouraged her to get an AD on file.    Depression screen:  See questionnaire below.      05/08/2023    3:08 PM 01/11/2022    1:24 PM 10/06/2020    8:36 AM 09/23/2019    8:25 AM 09/01/2018    9:27 AM  Depression screen PHQ 2/9  Decreased Interest 0 0 0 0 0  Down, Depressed, Hopeless 0 0 0 0 0  PHQ - 2 Score 0 0 0 0 0    Fall Risk Screen: see questionnaire below.    11/28/2022   11:45 AM 01/11/2022    1:24 PM 10/06/2020    8:35 AM 09/23/2019    8:25 AM 08/29/2016   10:36 AM  Fall Risk   Falls in the past year? 0 1 0 0 No  Number falls in past yr: 0 0     Injury with Fall? 0 1     Risk for fall due to : No Fall Risks Impaired balance/gait No Fall Risks    Follow up Falls evaluation completed Falls evaluation completed       ADL screen:  See questionnaire below Functional Status Survey:     Review of Systems Constitutional: -, -unexpected weight change, -anorexia, -fatigue Allergy: -sneezing, -itching, -congestion Dermatology: denies changing moles, rash, lumps ENT: -runny nose, -ear pain, -sore throat,  Cardiology:  -chest pain, -palpitations, -orthopnea, Respiratory: -cough, -shortness of breath, -dyspnea on exertion, -wheezing,  Gastroenterology: -abdominal pain, -nausea, -vomiting, -diarrhea, -constipation, -dysphagia Hematology: -bleeding or bruising problems Musculoskeletal: -arthralgias, -myalgias, -joint swelling, -back pain, - Ophthalmology: -vision changes,  Urology: -dysuria, -difficulty urinating,  -urinary frequency, -urgency, incontinence Neurology: -, -numbness, , -memory loss, -falls, -dizziness    PHYSICAL EXAM:  BP 138/70   Pulse 77   Temp 98.1 F (36.7 C) (Oral)   Resp 18   Ht 5' 3.25" (1.607  m)   Wt 180 lb (81.6 kg)   LMP 02/20/1999 (Exact Date)   SpO2 96% Comment: room air  BMI 31.63 kg/m   General Appearance: Alert, cooperative, no distress, appears stated age Head: Normocephalic, without obvious abnormality, atraumatic Eyes: PERRL, conjunctiva/corneas clear, EOM's intact,  Ears: Normal TM's and external ear canals Nose: Nares  normal, mucosa normal, no drainage or sinus tenderness Throat: Lips, mucosa, and tongue normal; teeth and gums normal Neck: Supple, no lymphadenopathy;  thyroid:  no enlargement/tenderness/nodules; no carotid bruit or JVD Lungs: Clear to auscultation bilaterally without wheezes, rales or ronchi; respirations unlabored Heart: Regular rate and rhythm, S1 and S2 normal, no murmur, rubor gallop Abdomen: Soft, non-tender, nondistended, normoactive bowel sounds,  no masses, no hepatosplenomegaly Skin:  Skin color, texture, turgor normal, no rashes or lesions Lymph nodes: Cervical, supraclavicular, and axillary nodes normal Neurologic:  CNII-XII intact, normal strength, sensation and gait; reflexes 2+ and symmetric throughout Psych: Normal mood, affect, hygiene and grooming. Hemoglobin A1c is 6.8. ASSESSMENT/PLAN: Routine general medical examination at a health care facility  Osteopenia, unspecified location  Hyperlipidemia with target LDL less than 100 - Plan: Lipid panel  History of breast cancer in female  Type 2 diabetes mellitus with other specified complication, without long-term current use of insulin (HCC)  Essential hypertension  Vitamin D deficiency  H/O bilateral mastectomy - 2005  Screening for diabetes mellitus - Plan: POCT glycosylated hemoglobin (Hb A1C)  Continue on present medication regimen however I will check blood work for triglycerides.  Otherwise recheck here in 4 months.  Discussed monthly  goals of calcium and vitamin D intake  Immunization recommendations discussed.  Recommend RSV colonoscopy recommendations reviewed   Medicare Attestation I have personally reviewed: The patient's medical and social history Their use of alcohol, tobacco or illicit drugs Their current medications and supplements The patient's functional ability including ADLs,fall risks, home safety risks, cognitive, and hearing and visual impairment Diet and physical activities Evidence  for depression or mood disorders  The patient's weight, height, and BMI have been recorded in the chart.  I have made referrals, counseling, and provided education to the patient based on review of the above and I have provided the patient with a written personalized care plan for preventive services.     Sharlot Gowda, MD   05/08/2023

## 2023-05-09 LAB — LIPID PANEL
Chol/HDL Ratio: 3.7 ratio (ref 0.0–4.4)
Cholesterol, Total: 140 mg/dL (ref 100–199)
HDL: 38 mg/dL — ABNORMAL LOW (ref >39)
LDL Chol Calc (NIH): 67 mg/dL (ref 0–99)
Triglycerides: 211 mg/dL — ABNORMAL HIGH (ref 0–149)
VLDL Cholesterol Cal: 35 mg/dL (ref 5–40)

## 2023-05-28 ENCOUNTER — Other Ambulatory Visit: Payer: Self-pay | Admitting: Family Medicine

## 2023-05-28 DIAGNOSIS — E1169 Type 2 diabetes mellitus with other specified complication: Secondary | ICD-10-CM

## 2023-06-01 ENCOUNTER — Other Ambulatory Visit: Payer: Self-pay | Admitting: Family Medicine

## 2023-06-01 DIAGNOSIS — E1169 Type 2 diabetes mellitus with other specified complication: Secondary | ICD-10-CM

## 2023-06-13 ENCOUNTER — Other Ambulatory Visit: Payer: Self-pay | Admitting: Family Medicine

## 2023-06-13 DIAGNOSIS — I10 Essential (primary) hypertension: Secondary | ICD-10-CM

## 2023-09-03 ENCOUNTER — Other Ambulatory Visit: Payer: Self-pay

## 2023-09-03 ENCOUNTER — Other Ambulatory Visit: Payer: Self-pay | Admitting: Family Medicine

## 2023-09-03 DIAGNOSIS — E119 Type 2 diabetes mellitus without complications: Secondary | ICD-10-CM

## 2023-09-03 DIAGNOSIS — E1169 Type 2 diabetes mellitus with other specified complication: Secondary | ICD-10-CM

## 2023-09-03 MED ORDER — METFORMIN HCL 500 MG PO TABS
500.0000 mg | ORAL_TABLET | Freq: Two times a day (BID) | ORAL | 1 refills | Status: DC
Start: 2023-09-03 — End: 2024-03-11

## 2023-09-21 ENCOUNTER — Encounter (HOSPITAL_BASED_OUTPATIENT_CLINIC_OR_DEPARTMENT_OTHER): Payer: Self-pay | Admitting: Emergency Medicine

## 2023-09-21 ENCOUNTER — Other Ambulatory Visit: Payer: Self-pay

## 2023-09-21 ENCOUNTER — Emergency Department (HOSPITAL_BASED_OUTPATIENT_CLINIC_OR_DEPARTMENT_OTHER)
Admission: EM | Admit: 2023-09-21 | Discharge: 2023-09-21 | Disposition: A | Discharge status: Home / Self Care | Payer: Medicare HMO | Attending: Emergency Medicine | Admitting: Emergency Medicine

## 2023-09-21 DIAGNOSIS — R197 Diarrhea, unspecified: Secondary | ICD-10-CM | POA: Diagnosis not present

## 2023-09-21 DIAGNOSIS — A084 Viral intestinal infection, unspecified: Secondary | ICD-10-CM | POA: Insufficient documentation

## 2023-09-21 LAB — CBC WITH DIFFERENTIAL/PLATELET
Abs Immature Granulocytes: 0.04 10*3/uL (ref 0.00–0.07)
Basophils Absolute: 0 10*3/uL (ref 0.0–0.1)
Basophils Relative: 1 %
Eosinophils Absolute: 0.2 10*3/uL (ref 0.0–0.5)
Eosinophils Relative: 2 %
HCT: 36.3 % (ref 36.0–46.0)
Hemoglobin: 12.2 g/dL (ref 12.0–15.0)
Immature Granulocytes: 1 %
Lymphocytes Relative: 24 %
Lymphs Abs: 2.1 10*3/uL (ref 0.7–4.0)
MCH: 29.5 pg (ref 26.0–34.0)
MCHC: 33.6 g/dL (ref 30.0–36.0)
MCV: 87.7 fL (ref 80.0–100.0)
Monocytes Absolute: 0.8 10*3/uL (ref 0.1–1.0)
Monocytes Relative: 10 %
Neutro Abs: 5.3 10*3/uL (ref 1.7–7.7)
Neutrophils Relative %: 62 %
Platelets: 251 10*3/uL (ref 150–400)
RBC: 4.14 MIL/uL (ref 3.87–5.11)
RDW: 14 % (ref 11.5–15.5)
WBC: 8.4 10*3/uL (ref 4.0–10.5)
nRBC: 0 % (ref 0.0–0.2)

## 2023-09-21 LAB — COMPREHENSIVE METABOLIC PANEL
ALT: 33 U/L (ref 0–44)
AST: 24 U/L (ref 15–41)
Albumin: 4.2 g/dL (ref 3.5–5.0)
Alkaline Phosphatase: 55 U/L (ref 38–126)
Anion gap: 9 (ref 5–15)
BUN: 16 mg/dL (ref 8–23)
CO2: 25 mmol/L (ref 22–32)
Calcium: 8.7 mg/dL — ABNORMAL LOW (ref 8.9–10.3)
Chloride: 102 mmol/L (ref 98–111)
Creatinine, Ser: 0.93 mg/dL (ref 0.44–1.00)
GFR, Estimated: >60 mL/min (ref >60)
Glucose, Bld: 128 mg/dL — ABNORMAL HIGH (ref 70–99)
Potassium: 3.7 mmol/L (ref 3.5–5.1)
Sodium: 136 mmol/L (ref 135–145)
Total Bilirubin: 0.7 mg/dL (ref <1.2)
Total Protein: 6.7 g/dL (ref 6.5–8.1)

## 2023-09-21 LAB — MAGNESIUM: Magnesium: 1.9 mg/dL (ref 1.7–2.4)

## 2023-09-21 MED ORDER — LACTATED RINGERS IV BOLUS
1000.0000 mL | Freq: Once | INTRAVENOUS | Status: AC
  Administered 2023-09-21: 1000 mL via INTRAVENOUS

## 2023-09-21 NOTE — ED Provider Notes (Signed)
[place] EMERGENCY DEPARTMENT AT [facility] Provider Note   CSN: [id number] Arrival date & time: 09/21/23  8295     History  Chief Complaint  Patient presents with   Diarrhea    Francheska Jeffus is a 72 y.o. female.  Patient is a 72 year old female with a history of hypertension, diabetes and breast cancer status post complete treatment who is presenting today with complaints of ongoing diarrhea.  Patient reports on Monday approximately 7 days prior to arrival she started to have nausea vomiting and diarrhea.  This lasted for approximately 24 hours and the vomiting subsided but she has continued to have diarrhea over the last 7 days.  She denies any blood in the stool but it does seem to be mucousy.  She has not had any abdominal pain and reports fever the very first day but no ongoing fevers.  She denies any urinary issues and has been able to eat.  She has been trying to drink Pedialyte and did attempt taking Imodium but reports she still having at least 3 episodes of very voluminous diarrhea daily.  She went on a cruise in October but did not have any issues after getting back.  She is aware that there are been some people at work who have been ill but she is not aware of any foodborne illness.  Other than the Imodium no recent medication changes.  The history is provided by the patient and medical records.       Home Medications Prior to Admission medications   Medication Sig Start Date End Date Taking? Authorizing Provider  ACCU-CHEK GUIDE test strip USE TO TEST BLOOD GLUCOSE EVERY MORNING AND AT BEDTIME 09/03/23   Ronnald Nian, MD  Accu-Chek Softclix Lancets lancets USE TO TEST BLOOD GLUCOSE TWICE DAILY AS DIRECTED. 03/03/23   Ronnald Nian, MD  Calcium Carbonate Antacid (CALCIUM CARBONATE PO) Take 1,200 mg by mouth 2 (two) times daily with a meal.    [provider]  losartan (COZAAR) 50 MG tablet TAKE 1 TABLET(50 MG) BY MOUTH DAILY 06/13/23    Ronnald Nian, MD  metFORMIN (GLUCOPHAGE) 500 MG tablet Take 1 tablet (500 mg total) by mouth 2 (two) times daily with a meal. 09/03/23   Ronnald Nian, MD  rosuvastatin (CRESTOR) 20 MG tablet TAKE 1 TABLET(20 MG) BY MOUTH DAILY 01/09/23   Ronnald Nian, MD  VITAMIN D PO Take 5,000 Units by mouth daily.    [provider]      Allergies    Patient has no active allergies.    Review of Systems   Review of Systems  Physical Exam Updated Vital Signs BP (!) 149/94   Pulse 81   Temp 98.5 F (36.9 C) (Oral)   Resp 20   LMP 02/20/1999 (Exact Date)   SpO2 97%  Physical Exam Vitals and nursing note reviewed.  Constitutional:      General: She is not in acute distress.    Appearance: She is well-developed.  HENT:     Head: Normocephalic and atraumatic.     Mouth/Throat:     Mouth: Mucous membranes are dry.  Eyes:     Pupils: Pupils are equal, round, and reactive to light.  Cardiovascular:     Rate and Rhythm: Normal rate and regular rhythm.     Heart sounds: Normal heart sounds. No murmur heard.    No friction rub.  Pulmonary:     Effort: Pulmonary effort is normal.  Breath sounds: Normal breath sounds. No wheezing or rales.  Abdominal:     General: Bowel sounds are normal. There is no distension.     Palpations: Abdomen is soft.     Tenderness: There is no abdominal tenderness. There is no guarding or rebound.  Musculoskeletal:        General: No tenderness. Normal range of motion.     Comments: No edema  Skin:    General: Skin is warm and dry.     Findings: No rash.  Neurological:     Mental Status: She is alert and oriented to person, place, and time.     Cranial Nerves: No cranial nerve deficit.  Psychiatric:        Behavior: Behavior normal.     ED Results / Procedures / Treatments   Labs (all labs ordered are listed, but only abnormal results are displayed) Labs Reviewed  COMPREHENSIVE METABOLIC PANEL - Abnormal; Notable for the following  components:      Result Value   Glucose, Bld 128 (*)    Calcium 8.7 (*)    All other components within normal limits  GASTROINTESTINAL PANEL BY PCR, STOOL (REPLACES STOOL CULTURE)  CBC WITH DIFFERENTIAL/PLATELET  MAGNESIUM    EKG None  Radiology No results found.  Procedures Procedures    Medications Ordered in ED Medications  lactated ringers bolus 1,000 mL (1,000 mLs Intravenous New Bag/Given 09/21/23 0913)    ED Course/ Medical Decision Making/ A&P                                 Medical Decision Making Amount and/or Complexity of Data Reviewed Labs: ordered. Decision-making details documented in ED Course.   Patient presenting today with symptoms most consistent with an infectious type diarrhea.  She has been on no antibiotics and multiple months and low suspicion for C. difficile.  She has no abdominal pain concerning for colitis or diverticulitis.  Concern for possible electrolyte abnormality given all the stooling.  Suspect viral or foodborne illness based on patient's symptoms.  No blood in her stool.  Patient given IV fluids.  Labs pending.  10:02 AM I independently interpreted patient's labs and CBC, CMP and magnesium are all within normal limits.  Patient did not produce any stool here but discussed with her ongoing supportive measures, return precautions and following up with PCP if diarrhea does not resolve.        Final Clinical Impression(s) / ED Diagnoses Final diagnoses:  Diarrhea of presumed infectious origin    Rx / DC Orders ED Discharge Orders     None         Gwyneth Sprout, MD 09/21/23 1002

## 2023-09-21 NOTE — ED Triage Notes (Signed)
Vomiting one day, but diarrhea still. Pt has been able to eat/drink. Voiding with no difficulty.

## 2023-09-21 NOTE — ED Notes (Signed)
Pt ambulated with steady gait to restroom ?

## 2023-09-21 NOTE — ED Triage Notes (Signed)
Diarrhea for 6 days, multiple episodes a day. Has some mucus in it.

## 2023-09-21 NOTE — Discharge Instructions (Addendum)
All blood work today is normal.  Continue to stay hydrated and you can take Imodium as needed for the diarrhea.  Return if you start having abdominal pain or fever.

## 2023-09-24 ENCOUNTER — Ambulatory Visit (INDEPENDENT_AMBULATORY_CARE_PROVIDER_SITE_OTHER): Payer: Medicare HMO | Admitting: Family Medicine

## 2023-09-24 VITALS — BP 130/72 | HR 81 | Temp 98.4°F | Wt 175.0 lb

## 2023-09-24 DIAGNOSIS — A084 Viral intestinal infection, unspecified: Secondary | ICD-10-CM | POA: Diagnosis not present

## 2023-09-24 NOTE — Progress Notes (Signed)
   Subjective:    Patient ID: [name], female    DOB: July 19, 1951, 72 y.o.   MRN: [id number]  HPI She started having difficulty with nausea, vomiting and diarrhea about a week ago.  She was seen in the emergency room on December 1 for this.  That record was reviewed.  She is started to take Imodium and she has been doing clear liquids.  She did have a semiformed stool this morning and did use Imodium as directed.   Review of Systems     Objective:    Physical Exam Alert and in no distress.  Abdominal exam shows no masses or tenderness with decreased bowel sounds       Assessment & Plan:  Viral gastroenteritis You can eat anything that you want that you know want cause stomach irritation.  Start taking a probiotic.  Follow the directions of taking the Imodium and you can actually take up to 8/day if you need to

## 2023-09-24 NOTE — Patient Instructions (Signed)
You can eat anything that you want that you know want cause stomach irritation.  Start taking a probiotic.  Follow the directions of taking the Imodium and you can actually take up to 8/day if you need to

## 2023-10-22 HISTORY — PX: OTHER SURGICAL HISTORY: SHX169

## 2023-12-16 ENCOUNTER — Encounter: Payer: Self-pay | Admitting: Internal Medicine

## 2023-12-19 ENCOUNTER — Other Ambulatory Visit: Payer: Self-pay | Admitting: Family Medicine

## 2023-12-19 DIAGNOSIS — I10 Essential (primary) hypertension: Secondary | ICD-10-CM

## 2024-01-17 ENCOUNTER — Other Ambulatory Visit: Payer: Self-pay | Admitting: Family Medicine

## 2024-01-17 DIAGNOSIS — E785 Hyperlipidemia, unspecified: Secondary | ICD-10-CM

## 2024-01-21 DIAGNOSIS — E119 Type 2 diabetes mellitus without complications: Secondary | ICD-10-CM | POA: Diagnosis not present

## 2024-01-21 DIAGNOSIS — H5203 Hypermetropia, bilateral: Secondary | ICD-10-CM | POA: Diagnosis not present

## 2024-01-21 DIAGNOSIS — H43813 Vitreous degeneration, bilateral: Secondary | ICD-10-CM | POA: Diagnosis not present

## 2024-03-10 ENCOUNTER — Other Ambulatory Visit: Payer: Self-pay | Admitting: Family Medicine

## 2024-03-10 DIAGNOSIS — E1169 Type 2 diabetes mellitus with other specified complication: Secondary | ICD-10-CM

## 2024-03-29 ENCOUNTER — Other Ambulatory Visit: Payer: Self-pay | Admitting: Family Medicine

## 2024-03-29 DIAGNOSIS — I10 Essential (primary) hypertension: Secondary | ICD-10-CM

## 2024-05-13 ENCOUNTER — Ambulatory Visit: Payer: Medicare HMO | Admitting: Family Medicine

## 2024-05-16 DIAGNOSIS — E785 Hyperlipidemia, unspecified: Secondary | ICD-10-CM | POA: Diagnosis not present

## 2024-05-16 DIAGNOSIS — Z87440 Personal history of urinary (tract) infections: Secondary | ICD-10-CM | POA: Diagnosis not present

## 2024-05-16 DIAGNOSIS — Z1152 Encounter for screening for COVID-19: Secondary | ICD-10-CM | POA: Diagnosis not present

## 2024-05-16 DIAGNOSIS — R531 Weakness: Secondary | ICD-10-CM | POA: Diagnosis not present

## 2024-05-16 DIAGNOSIS — E119 Type 2 diabetes mellitus without complications: Secondary | ICD-10-CM | POA: Diagnosis not present

## 2024-05-16 DIAGNOSIS — R509 Fever, unspecified: Secondary | ICD-10-CM | POA: Diagnosis not present

## 2024-05-16 DIAGNOSIS — R079 Chest pain, unspecified: Secondary | ICD-10-CM | POA: Diagnosis not present

## 2024-05-16 DIAGNOSIS — B9620 Unspecified Escherichia coli [E. coli] as the cause of diseases classified elsewhere: Secondary | ICD-10-CM | POA: Diagnosis not present

## 2024-05-16 DIAGNOSIS — R7881 Bacteremia: Secondary | ICD-10-CM | POA: Diagnosis not present

## 2024-05-16 DIAGNOSIS — E7800 Pure hypercholesterolemia, unspecified: Secondary | ICD-10-CM | POA: Diagnosis not present

## 2024-05-16 DIAGNOSIS — N3000 Acute cystitis without hematuria: Secondary | ICD-10-CM | POA: Diagnosis not present

## 2024-05-16 DIAGNOSIS — Z7984 Long term (current) use of oral hypoglycemic drugs: Secondary | ICD-10-CM | POA: Diagnosis not present

## 2024-05-16 DIAGNOSIS — R059 Cough, unspecified: Secondary | ICD-10-CM | POA: Diagnosis not present

## 2024-05-16 DIAGNOSIS — N12 Tubulo-interstitial nephritis, not specified as acute or chronic: Secondary | ICD-10-CM | POA: Diagnosis not present

## 2024-05-16 DIAGNOSIS — N132 Hydronephrosis with renal and ureteral calculous obstruction: Secondary | ICD-10-CM | POA: Diagnosis not present

## 2024-05-16 DIAGNOSIS — I10 Essential (primary) hypertension: Secondary | ICD-10-CM | POA: Diagnosis not present

## 2024-05-16 DIAGNOSIS — N390 Urinary tract infection, site not specified: Secondary | ICD-10-CM | POA: Diagnosis not present

## 2024-05-16 DIAGNOSIS — N201 Calculus of ureter: Secondary | ICD-10-CM | POA: Diagnosis not present

## 2024-05-16 DIAGNOSIS — N136 Pyonephrosis: Secondary | ICD-10-CM | POA: Diagnosis not present

## 2024-05-16 DIAGNOSIS — R110 Nausea: Secondary | ICD-10-CM | POA: Diagnosis not present

## 2024-05-17 NOTE — Progress Notes (Signed)
 Name: [name] [facility] LOS: 1 days  DOB: 07/09/1951 Room: R1A16/R1A16A  MRN: [id number]  Level of Care: Telemetry/monitored    Subjective  Patient feels well. No fever, no pain, no nausea. BC positive for E. Coli, sensitivity pending. Will need 2 weeks culture directed antibiotics. She is visiting town for the [employer] and will be needing a urologist in [place] for f/u.   Objective  Vital signs  Temperature: 98 F (36.7 C)  Pulse: 81 per minute  Respiration: 19 per minute  Blood pressure: 120/56 mmHg  SpO2 96 % on O2 Device: Nasal cannula      Intake/Output Summary (Last 24 hours) at 05/17/2024 1400 Last data filed at 05/17/2024 9062 Gross per 24 hour  Intake 350 ml  Output 53 ml  Net 297 ml   Date of Last Bowel Movement: 05/17/24   Tele:  Normal sinus rhythm Cardiac Regularity: Regular Invasive devices: PIV 05/16/24 20 G Left Antecubital (Active)  Site Assessment No redness/no tenderness/no swelling 05/17/24 0805  Line Status Infusing;Flushed 05/17/24 0805  Dressing Type Transparent 05/17/24 0805  Dressing Status   Clean/Dry/Intact 05/17/24 0805     Ureteral Drain/Stent Left ureter 6 Fr. (Active)  Site Assessment Clean 05/16/24 1430  Dressing Type Open to air 05/16/24 1430   POSS: 1 - Awake and alert (Acceptable) [place] Agitation Sedation Scale (RASS): Alert and Calm  GEN: awake, alert, no distress.  CV: Heart rhythm regular. No peripheral edema. RESP: Normal work of breathing. Clear to auscultation bilaterally.  GI: Abdomen soft, nontender, +BS.  NEUROPSYCH: Normal speech, no focal findings. Alert and oriented. No agitation.  Medications: Scheduled Meds: . acetaminophen  1,000 mg Oral Q6H  . cefTRIAXone  2 g Intravenous Daily  . docusate sodium  100 mg Oral BID  . heparin (porcine)  5,000 Units Subcutaneous Q12H [facility]  . insulin lispro  0-20 Units Subcutaneous Before meals & nightly  . [Held by provider] ketorolac  15 mg Intravenous Q6H  .  lidocaine  2 patch Transdermal Daily  . rosuvastatin   20 mg Oral Nightly   Continuous Infusions: PRN Meds:.dextrose 50 % in water, [Held by provider] ketorolac, melatonin, ondansetron, oxyCODONE, polyethylene glycol   Assessment/Plan  [name] is a 73 year old female with a history of hypertension, type 2 diabetes mellitus, and hyperlipidemia, who was admitted for an infected left proximal ureteral stone with hydronephrosis.  # Ureteral Stone with Hydronephrosis  Presented with burning urination, right flank pain, chills, nausea, and vomiting, and was found to have an 8 mm left proximal ureteral stone causing moderate hydronephrosis. She underwent urgent cystoscopy with retrograde pyelography and left ureteral stent placement on 05/16/2024. The procedure was uncomplicated, and she was stable post-operatively. She is planned for definitive lithotripsy after completing a course of culture directed antibiotics x 2 weeks. Follow-up care with a urologist in [place] is recommended.  # Complicated E. Coli UTI # E. Coli bacteremia  Diagnosed with a urinary tract infection (UTI) associated with the ureteral stone. She is on  IV Rocephin and is recommended to continue culture-directed antibiotics for two weeks. Blood cultures revealed E. coli bacteremia, and sensitivities are pending. Her leukocytosis is improving, and she is currently afebrile.  # Essential Hypertension  Meds adjusted during hospitalization due to soft blood pressure and active infection; losartan  continues to be held.   # Type 2 Diabetes Mellitus  Holding metformin  and using sliding scale insulin. Her glucose levels were monitored. Will add on a Hgb A1C.   Reviewed lab  data, radiographic data, ECG, medications, and hospital notes  I have reviewed/summarized previous records, I have discussed care with CM, RN   Code Status: Full Code  Condition: Stable  VTE Prophylaxis: Heparin prophylactic dose  Medically Clear:   No, would like culture resulted  Disposition:   Return to prior level of care and/or community level without post-acute care at [facility]         Dianne Bridges, MD

## 2024-05-17 NOTE — Progress Notes (Signed)
 [facility] Progress Note  POD#1  s/p left ureteral stent  Subjective: Interval History: [name] is feeling well today.  Last low-grade fever was yesterday around 1500  Overnight events: No acute events overnight  Objective: Vital signs: BP 169/74 (BP Location: Left upper arm, BP Position: Supine, Cuff Size: Adult (Navy Blue))   Pulse 76   Temp 97.8 F (36.6 C) (Oral)   Resp 18   Ht 1.575 m (5' 2)   Wt 81.6 kg (179 lb 14.3 oz)   SpO2 94%   BMI 32.90 kg/m      Intake/Output:  Intake/Output Summary (Last 24 hours) at 05/17/2024 9271 Last data filed at 05/16/2024 2200 Gross per 24 hour  Intake 1415 ml  Output 128 ml  Net 1287 ml    Physical Exam: General appearance: alert, appears stated age, cooperative and no distress Lungs: unlabored breathing Heart: regular rate Abdomen: soft, non-tender GU: Voiding spontaneously Extremities: extremities normal, atraumatic, no cyanosis or edema  Diagnostic Studies: Lab Results  Component Value Date   WBC 15.7 (H) 05/16/2024   HGB 10.6 (L) 05/16/2024   HCT 33 05/16/2024   MCV 88 05/16/2024   PLT 250 05/16/2024    Lab Results  Component Value Date   CREAT 1.28 (H) 05/16/2024   BUN 19 05/16/2024   NA 136 05/16/2024   K 4.2 05/16/2024   CL 101 05/16/2024   CO2 23 05/16/2024    Imaging:   CT renal colic (05/16/24)  IMPRESSION:  1. Moderate left-sided hydronephrosis secondary to a 8 mm proximal left  ureteral calculus.  2.  Diverticulosis. No evidence of acute diverticulitis.   Assessment/Plan: 73 year old female with a past medical history of UTI, HTN, HLD, T2DM.  She presented to the ED and was diagnosed with an 8 mm left proximal ureteral stone in the setting of infection. She is s/p left ureteral stent placement with Dr. Alethea on 05/16/2024.  - Tmax 100.3, remaining VSS - Leukocytosis improving at 13 from 15.7 - H&H stable at 10.4/32 - Creatinine improving at 1.03 from 1.28 - Ucx: Pending.  She  is currently on 2 g Rocephin.  Recommend 2 weeks of culture directed antibiotics - BCx: E. coli bacteremia - Stent colic medications  - Flomax 0.4mg  nightly  - Pyridium 200 mg PRN  - Toradol 15 mg PRN - Patient prefers to follow-up in [place] with a urologist for definitive stone treatment   [name] 05/17/2024 7:37 AM   [facility]  [facility] APP on call with questions and concerns    Principle Problem: Ureteral stone with hydronephrosis  Hospital Problems: Principal Problem:   Ureteral stone with hydronephrosis Active Problems:   Essential hypertension   Type 2 diabetes mellitus without complication, without long-term current use of insulin (CMS/HCC v28)   Acute cystitis without hematuria  Attending Attestation: I, the attending physician, evaluated and examined this patient today.  The findings, assessment, and plan were reviewed with the nurse practitioner.  I agree with the above documentation with the following additions or changes:   Urology Attending Addendum Patient feeling much better.  Describes low-grade fever yesterday afternoon.  Blood culture with preliminary E. coli and culture sensitivities pending.  Leukocytosis slowly improving.  Stable/normal creatinine.  Recommend continued IV antibiotics until culture sensitivities are back and then she will need 2 weeks of appropriate antibiotic therapy.  She plans on obtaining her future urologic care in [place].  Encouraged to begin working on obtaining a urologist in [place]  as soon as possible to prevent a delay of care for her stone/stent.  Discussed that a stent can remain in place for at most 3 months before it has to be exchanged otherwise it can cause stones or other problems.  She understands this and will work on obtaining a new urologist in [place] as she desires.  Urology will sign off.  Please call with further questions.  Penne Glade Hudson, MD 05/17/2024 11:33 AM

## 2024-05-18 NOTE — Discharge Summary (Signed)
 Discharge Summary   Name: [name] DOB: 07-30-1951 MRN: [id number]  Room: R1A16/R1A16A Date of admission: 05/16/2024 Date of discharge:   05/18/24    Primary Care Provider: Norleen JAYSON Jobs, MD (cc) Admitting Service:  [facility] Discharging Service: [facility] HOSPITALIST 14 Consultants:   PHARMACY COMMUNICATION UROLOGY CONSULT   -------------------------------------------------------------------------------  [name] is a 73 year old female with a history of hypertension, type 2 diabetes mellitus, and hyperlipidemia, who was admitted for an infected left proximal ureteral stone with hydronephrosis.  #Ureteral Stone with Hydronephrosis The patient presented with burning urination, right flank pain, chills, nausea, and vomiting, and was found to have an 8 mm left proximal ureteral stone causing moderate hydronephrosis. She underwent urgent cystoscopy with retrograde pyelography and left ureteral stent placement on 05/16/2024. The procedure was uncomplicated, and she was stable post-operatively. She is planned for definitive lithotripsy after completing a course of culture-directed antibiotics for two weeks. Follow-up care with a urologist in [place] is recommended, referral given.  #Complicated UTI and E. Coli Bacteremia The patient was diagnosed with a urinary tract infection (UTI) associated with the ureteral stone. She is on IV Rocephin and is recommended to continue culture-directed antibiotics for two weeks. Blood cultures revealed E. coli bacteremia, and sensitivities are noted.  Her leukocytosis resolved, and she is currently afebrile. Will be sent home on vantin to finish treatment.  #Essential Hypertension Her hypertension medications were adjusted during hospitalization due to soft blood pressure and active infection; losartan  resumed at discharge.   #Type 2 Diabetes Mellitus Metformin  was held, and she was placed on sliding scale insulin. Her  glucose levels were monitored, and an Hgb A1C was added on.   ------------------------------------------------------------------------------- Discharge Medications:   Discharge Medications     .    cefpodoxime 200 MG tablet Take 1 tablet (200 mg total) by mouth 2 (two) times a day for 11 days. Commonly known as: VANTIN   losartan  50 MG tablet Take 1 tablet (50 mg total) by mouth daily. Commonly known as: COZAAR    metformin  500 MG tablet Take 1 tablet (500 mg total) by mouth 2 (two) times a day with meals. Commonly known as: GLUCOPHAGE    rosuvastatin  20 MG tablet Take 1 tablet (20 mg total) by mouth daily. Commonly known as: CRESTOR         ------------------------------------------------------------------------------- Discharge Instructions:  Dispo:  Discharge Disposition     Discharge Orders     Discharge Patient: Expected discharge date: 05/18/2024 Expected discharge time: 10:24  Disposition: Home or Self Care Destination: --             Diet:  Diet Instructions     Regular diet        Activity:  Activity Instructions     Activity Activity: As tolerated     Activity: As tolerated      Follow up:  Physician Follow ups     Norleen JAYSON Jobs, MD  Specialty: Family Medicine  Relationship: PCP - General   [place] [address] [place] [address]  Phone: [phone]     Next Steps: Follow up      No future appointments.  Referrals:  Specialty Referrals  Procedures  . Referral to Urology    Referral Priority:   Routine    Referral Type:   Consultation    Referral Reason:   Specialty Services Required    Requested Specialty:   Urology    Number of Visits Requested:   1    Expiration Date:  05/18/2025   Miscelaneous:   -------------------------------------------------------------------------------  Code Status:  No Order  Most Recent Labs: Lab Results  Component Value Date   WBC 13.0 (H) 05/17/2024   HGB 10.4 (L)  05/17/2024   HCT 32 05/17/2024   PLT 271 05/17/2024   INR 1.48 05/16/2024   NA 139 05/17/2024   K 4.0 05/17/2024   CL 101 05/17/2024   CO2 25 05/17/2024   BUN 19 05/17/2024   CREAT 1.03 05/17/2024   GLU 106 05/17/2024   CA 8.7 05/17/2024   MG 1.9 05/17/2024   ALB 3.5 05/17/2024   TP 6.3 (L) 05/17/2024   ALT 25 05/17/2024   AST 21 05/17/2024   BILIT 0.6 05/17/2024   LIPAS 11 05/16/2024   PA1C 6.3 (H) 05/17/2024   UWBC 21-50 (A) 05/16/2024   URBC <3 05/16/2024   UBAC 2+ (A) 05/16/2024   ULEU MODERATE (A) 05/16/2024   UNIT NEGATIVE 05/16/2024    Unresulted Labs     None      Hospital Radiology: ECG 12 lead; Result Date: 05/16/2024 Ventricular Rate: 85 Atrial Rate: 85 P-R Interval: 168 QRS Duration: 92 Q-T Interval: 398 QTC Calculation(Bazett): 473 P Axis: 54 R Axis: -7 T Axis: 53  Normal sinus rhythm Normal ECG No previous ECGs available Confirmed by MAROLYN, GREGORY ([id number]) on 05/16/2024 3:12:46 PM  XR FL Fluoro 0-60 Min Result Date: 05/16/2024 FLUOROSCOPY 0-60 MINUTES Total Room Time:  Number of images saved:  Modality Radiation Exposure: Air Kerma: 1.64 mGy ; DAP: 0 Gy-cm2  CT Abdomen Pelvis Renal Colic Without IV Contrast Result Date: 05/16/2024 CT ABDOMEN AND PELVIS, RENAL COLIC PROTOCOL: HISTORY: Renal colic, UTI symptoms COMPARISON: None CONTRAST: None TECHNIQUE: Spiral CT performed from the dome of the diaphragm to the pubic symphysis without intravenous or oral contrast. DICOM format image data available to non-affiliated external healthcare facilities or entities on a secure, media free, reciprocally searchable basis with patient authorization for at least a 12 month period after the study. Dose reduction technique was used on this scan by utilizing automated exposure control, adjustment of the mA and/or kV according to the patient size. FINDINGS: Scout film: Unremarkable GU: Moderate left-sided hydronephrosis secondary to a 8 mm proximal left ureteral calculus.  Asymmetric left perinephric fat stranding. Normal adrenal glands. No abnormality identified in the bladder. Uterus and bilateral adnexa are within normal limits. Lung bases: No significant opacities or pleural fluid. Hepatobiliary: No focal hepatic lesion or intrahepatic biliary dilation. Pancreas normal morphology and duct caliber. Gallbladder appears normal. Normal splenic parenchyma. GI: No evidence of bowel obstruction. No mesenteric stranding or wall thickening. No intraperitoneal adenopathy, masses or fluid. The appendix is not visualized. Diverticulosis. Retroperitoneum: No retroperitoneal adenopathy. Cardiovascular: Normal caliber aorta. Musculoskeletal: No focal lytic or blastic lesion. No fracture or acute derangement. Mild degenerative changes of the spine.   IMPRESSION: 1.  Moderate left-sided hydronephrosis secondary to a 8 mm proximal left ureteral calculus. 2.  Diverticulosis. No evidence of acute diverticulitis.   ECG 12 lead; Result Date: 05/16/2024 Normal sinus rhythm Normal ECG No previous ECGs available Confirmed by MAROLYN, GREGORY ([id number]) on 05/16/2024 3:12:46 PM  CT Abdomen Pelvis Renal Colic Without IV Contrast Result Date: 05/16/2024 IMPRESSION: 1.  Moderate left-sided hydronephrosis secondary to a 8 mm proximal left ureteral calculus. 2.  Diverticulosis. No evidence of acute diverticulitis.   --------------------------------------------------------------------------------   Discharge Day Services: BP (!) 152/98 (BP Location: Left upper arm, BP Position: Supine, Cuff Size: Adult Field seismologist Blue))   Pulse 89  Temp 98.3 F (36.8 C) (Oral)   Resp 18   Ht 1.575 m (5' 2)   Wt 81.6 kg (179 lb 14.3 oz)   SpO2 96%   BMI 32.90 kg/m  Last Weight:  81.6 kg (179 lb 14.3 oz) Date of Last Bowel Movement: 05/17/24 Pt seen and examined on the day of discharge and determined appropriate for discharge to the above level of care.  Condition at Discharge: Stable Length of Discharge: I  spent more than 30 minutes in the discharge of this patient.      Dianne Bridges, MD, GENI

## 2024-05-19 ENCOUNTER — Telehealth: Payer: Self-pay

## 2024-05-19 NOTE — Transitions of Care (Post Inpatient/ED Visit) (Signed)
   05/19/2024  Name: [name] MRN: [id number] DOB: Dec 10, 1950  Today's TOC FU Call Status: Today's TOC FU Call Status:: Unsuccessful Call (1st Attempt) Unsuccessful Call (1st Attempt) Date: 05/19/24  Attempted to reach the patient regarding the most recent Inpatient/ED visit. RN CM left confidential voice mail with call back number direct to [facility] RN CM  Follow Up Plan: Additional outreach attempts will be made to reach the patient to complete the Transitions of Care (Post Inpatient/ED visit) call.   Bing Edison MSN, RN RN Case Sales executive Health  VBCI-Population Health Office Hours M-F [phone] Direct Dial: [phone] Main Phone [phone]  Fax: [phone] [email or web address]

## 2024-05-20 ENCOUNTER — Telehealth: Payer: Self-pay | Admitting: *Deleted

## 2024-05-20 NOTE — Transitions of Care (Post Inpatient/ED Visit) (Signed)
   05/20/2024  Name: [name] MRN: [id number] DOB: 02/17/1951  Today's TOC FU Call Status: Today's TOC FU Call Status:: Unsuccessful Call (2nd Attempt) Unsuccessful Call (2nd Attempt) Date: 05/20/24  Attempted to reach the patient regarding the most recent Inpatient visit.  Left HIPAA compliant voice message requesting call back   Follow Up Plan: Additional outreach attempts will be made to reach the patient to complete the Transitions of Care (Post Inpatient/ED visit) call.   Pls call/ message for questions,  Leniyah Martell Mckinney Tila Millirons, RN, BSN, CCRN Alumnus RN Care Manager  Transitions of Care  VBCI - [facility] [phone]: direct office

## 2024-05-21 ENCOUNTER — Telehealth: Payer: Self-pay

## 2024-05-21 NOTE — Transitions of Care (Post Inpatient/ED Visit) (Signed)
 05/21/2024  Name: [name] MRN: [id number] DOB: March 24, 1951  Today's TOC FU Call Status: Today's TOC FU Call Status:: Successful TOC FU Call Completed TOC FU Call Complete Date: 05/21/24 Patient's Name and Date of Birth confirmed.  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 Sig Documenting Provider Last Dose Status Informant  ACCU-CHEK GUIDE test strip [id number] Yes USE TO TEST BLOOD GLUCOSE EVERY MORNING AND AT BEDTIME Joyce Norleen BROCKS, MD  Active   Accu-Chek Softclix Lancets lancets [id number] Yes USE TO TEST BLOOD GLUCOSE TWICE DAILY AS DIRECTED. Joyce Norleen BROCKS, MD  Active   bismuth subsalicylate (PEPTO BISMOL) 262 MG chewable tablet [id number] Yes Chew 524 mg by mouth as needed. [provider]  Active   Calcium  Carbonate Antacid (CALCIUM  CARBONATE PO) [id number]  Take 1,200 mg by mouth 2 (two) times daily with a meal.  Patient not taking: Reported on 05/21/2024   [provider]  Active Self  cefpodoxime (VANTIN) 200 MG tablet [id number] Yes Take 200 mg by mouth 2 (two) times daily. For 11 days [provider]  Active   loperamide (IMODIUM) 2 MG capsule [id number] Yes Take by mouth  as needed for diarrhea or loose stools. [provider]  Active   losartan  (COZAAR ) 50 MG tablet [id number] Yes TAKE 1 TABLET(50 MG) BY MOUTH DAILY Lalonde, John C, MD  Active   metFORMIN  (GLUCOPHAGE ) 500 MG tablet [id number] Yes TAKE 1 TABLET(500 MG) BY MOUTH TWICE DAILY WITH A MEAL Lalonde, John C, MD  Active   Oral Electrolytes (LIQUID I.V. PO) [id number] Yes Take by mouth. [provider]  Active   Pedialyte WYLINE) SOLN [id number] Yes Take by mouth. [provider]  Active   rosuvastatin  (CRESTOR ) 20 MG tablet [id number] Yes TAKE 1 TABLET(20 MG) BY MOUTH DAILY Lalonde, John C, MD  Active   VITAMIN D  PO [id number]  Take 5,000 Units by mouth daily.  Patient not taking: Reported on 05/21/2024   [provider]  Active             Home Care and Equipment/Supplies: Were Home Health Services Ordered?: NA Any new equipment or medical supplies ordered?: NA  Functional Questionnaire: Do you need assistance with bathing/showering or dressing?: No Do you need assistance with meal preparation?: No Do you need assistance with eating?: No Do you have difficulty maintaining continence: No Do you need assistance with getting out of bed/getting out of a chair/moving?: No Do you have difficulty managing or taking your medications?: No  Follow up appointments reviewed: PCP Follow-up appointment confirmed?: Yes Date of PCP follow-up appointment?: 05/26/24 Follow-up Provider: PCP, Dr Norleen Joyce Specialist [facility] Follow-up appointment confirmed?: NA Do you need transportation to your follow-up appointment?: No Do you understand care options if your condition(s) worsen?: Yes-patient  verbalized understanding  SDOH Interventions Today    Flowsheet Row Most Recent Value  SDOH Interventions   Food Insecurity Interventions Intervention Not Indicated  Housing Interventions Intervention Not Indicated  Transportation Interventions Intervention Not Indicated   Utilities Interventions Intervention Not Indicated   Patient declined enrollment in [facility] program - accepted [facility] RN number    Shona Prow RN, CCM [place]  VBCI-Population Health RN Care Manager [phone]

## 2024-05-24 ENCOUNTER — Telehealth: Payer: Self-pay | Admitting: Family Medicine

## 2024-05-24 NOTE — Telephone Encounter (Signed)
 Copied from CRM [id number]. Topic: Referral - Request for Referral >> May 24, 2024 12:54 PM Graeme ORN wrote: Did the patient discuss referral with their provider in the last year? No (If No - schedule appointment) (If Yes - send message)  Appointment offered? No - patient has appt 8/6  Type of order/referral and detailed reason for visit:  Urology [facility] said she needed to see urologist   Preference of office, provider, location: [facility] [address]   If referral order, have you been seen by this specialty before? N/A (If Yes, this issue or another issue? When? Where?  Can we respond through MyChart? Yes   ----------------------------------------------------------------------- From previous Reason for Contact - Appt Info/Confirm: Patient/patient representative is calling for information regarding an appointment.

## 2024-05-26 ENCOUNTER — Ambulatory Visit (INDEPENDENT_AMBULATORY_CARE_PROVIDER_SITE_OTHER): Admitting: Family Medicine

## 2024-05-26 ENCOUNTER — Encounter: Payer: Self-pay | Admitting: Family Medicine

## 2024-05-26 VITALS — BP 132/70 | HR 85 | Ht 62.0 in | Wt 171.4 lb

## 2024-05-26 DIAGNOSIS — Z960 Presence of urogenital implants: Secondary | ICD-10-CM | POA: Diagnosis not present

## 2024-05-26 DIAGNOSIS — N12 Tubulo-interstitial nephritis, not specified as acute or chronic: Secondary | ICD-10-CM | POA: Diagnosis not present

## 2024-05-26 DIAGNOSIS — N200 Calculus of kidney: Secondary | ICD-10-CM

## 2024-05-26 NOTE — Progress Notes (Signed)
   Subjective:    Patient ID: [name], female    DOB: [birth year], 73 y.o.   MRN: [id number]  Discussed the use of AI scribe software for clinical note transcription with the patient, who gave verbal consent to proceed.  History of Present Illness   [name] is a 73 year old female who presents for follow-up after recent hospitalization for a ureteral stone with hydronephrosis and pyelonephritis.  She was hospitalized from July 27th to July 29th for a ureteral stone with hydronephrosis and pyelonephritis. Initially, she thought she had a urinary tract infection but experienced an episode where she slid to the floor and could not get up. An 8 mm left proximal ureteral stone was identified, and she was treated with a stent placement. She is currently awaiting a follow-up appointment with a urologist to have the stent removed, as it is expected that the stone will come out with the stent.  During her hospitalization, she experienced nausea, vomiting, and chills. She is currently on an antibiotic, Cephra Bantam (200 mg twice per day), which causes diarrhea. She is also taking metformin  for diabetes, losartan  for hypertension, and rosuvastatin  for hyperlipidemia. Her recent A1c was 6.3.  No current fever, chills, abdominal pain, nausea, or vomiting. However, she still feels like she has a urinary tract infection, experiencing stinging or discomfort during urination, which she attributes to the stent.   The medical record was reviewed from the hospitalization.       Review of Systems     Objective:    Physical Exam Physical Exam    Alert and in no distress.  Abdominal exam shows no masses or tenderness.           Assessment & Plan:  Assessment and Plan    Left ureteral stone with hydronephrosis, stent in place 8 mm left proximal ureteral stone causing hydronephrosis. Stent placed to relieve obstruction. No current pain, urinary discomfort due to stent. Stent  removal scheduled in two weeks. Risk of additional stone formation if stent remains too long. - Refer to urology for urgent stent removal and stone management.  Type 2 diabetes mellitus Type 2 diabetes managed with metformin . Recent A1c 6.3, indicating good glycemic control. No changes planned due to recent acute illness affecting blood sugar. - Continue metformin  for diabetes management.  Hypertension Hypertension managed with losartan . - Continue losartan  for blood pressure management.  Hyperlipidemia Hyperlipidemia managed with rosuvastatin . - Continue rosuvastatin  for cholesterol management.     Follow-up here in 6 months.

## 2024-05-27 DIAGNOSIS — N201 Calculus of ureter: Secondary | ICD-10-CM | POA: Diagnosis not present

## 2024-06-01 ENCOUNTER — Other Ambulatory Visit: Payer: Self-pay | Admitting: Urology

## 2024-06-02 NOTE — Progress Notes (Signed)
 Left message on voicemail to return call for preop instructions. Will try again later.

## 2024-06-02 NOTE — H&P (Signed)
 Left ureteral stone   Ms. Barcelona is a 73 year old female with an 8 mm proximal left ureteral stone, s/p left ureteral stent placement on 05/16/2024 at [facility] requiring a 2 day hospitalization due to urosepsis--currently on vantin. Today, she reports no GU related complaints and is tolerating her stent well. Denies interval fever/chills, nausea/vomiting, dysuria or hematuria. No prior hx of kidney stones.     ALLERGIES: Percocet    MEDICATIONS: metFORMIN  HCl  Cefpodoxime Proxetil 200 MG Tablet  Losartan  Potassium  Rosuvastatin  Calcium      GU PSH: No GU PSH      PSH Notes: sent placement for kidney stones   NON-GU PSH: No Non-GU PSH    GU PMH: None   NON-GU PMH: Breast Cancer, History    FAMILY HISTORY: Breast Cancer - Mother Cancer - Runs in Family   SOCIAL HISTORY: Marital Status: Single Preferred Language: English; Race: White Current Smoking Status: Patient does not smoke anymore.   Tobacco Use Assessment Completed: Used Tobacco in last 30 days? Has never drank.  Drinks 2 caffeinated drinks per day.    REVIEW OF SYSTEMS:    GU Review Female:   Patient reports burning /pain with urination. Patient denies stream starts and stops, leakage of urine, have to strain to urinate, being pregnant, frequent urination, hard to postpone urination, trouble starting your stream, and get up at night to urinate.  Gastrointestinal (Upper):   Patient denies nausea, vomiting, and indigestion/ heartburn.  Gastrointestinal (Lower):   Patient denies diarrhea and constipation.  Constitutional:   Patient denies fever, night sweats, weight loss, and fatigue.  Skin:   Patient denies skin rash/ lesion and itching.  Eyes:   Patient denies blurred vision and double vision.  Ears/ Nose/ Throat:   Patient denies sore throat and sinus problems.  Hematologic/Lymphatic:   Patient denies swollen glands and easy bruising.  Cardiovascular:   Patient denies leg swelling and chest pains.  Respiratory:    Patient denies cough and shortness of breath.  Endocrine:   Patient denies excessive thirst.  Musculoskeletal:   Patient denies back pain and joint pain.  Neurological:   Patient denies headaches and dizziness.  Psychologic:   Patient denies depression and anxiety.   VITAL SIGNS:      05/27/2024 09:08 AM  Weight 171 lb / 77.56 kg  Height 62 in / 157.48 cm  BP 149/90 mmHg  Pulse 94 /min  Temperature 97.4 F / 36.3 C  BMI 31.3 kg/m   Complexity of Data:  Records Review:   Previous Hospital Records, Previous Patient Records   PROCEDURES:         KUB - 25981  A single view of the abdomen is obtained.      . Patient confirmed No Neulasta OnPro Device.   KUB today shows a calcification along the expected course of the left mid ureter, consistent with the stone seen on recent CT. Left ureteral stent in good position. There are no calcifications seen within either renal shadow or along the expected course of the right ureter. No bladder calcifications are seen. No bony or bowel abnormalities are appreciated.         Urinalysis w/Scope - 81001 Dipstick Dipstick Cont'd Micro  Color: Yellow Bilirubin: Neg WBC/hpf: 6 - 10/hpf  Appearance: Cloudy Ketones: Neg RBC/hpf: 20 - 40/hpf  Specific Gravity: 1.030 Blood: 1+ Bacteria: Many (>50/hpf)  pH: 5.5 Protein: 2+ Cystals: NS (Not Seen)  Glucose: Neg Urobilinogen: 0.2 Casts: NS (Not Seen)  Nitrites: Neg Trichomonas: Not Present    Leukocyte Esterase: 2+ Mucous: Present      Epithelial Cells: 0 - 5/hpf      Yeast: Few (1 - 5/hpf)      Sperm: Not Present    Notes:  qns to spin     ASSESSMENT:      ICD-10 Details  1 GU:   Ureteral calculus - N20.1 Left, Undiagnosed New Problem     PLAN:           Orders         Schedule Labs: Today 05/26/2024 - Urine Culture  X-Rays: Today 05/26/2024 - KUB  Return Visit/Planned Activity: Next Available Appointment - Schedule Surgery          Document Letter(s):  Created for Patient: Clinical  Summary         Notes:   The risks, benefits and alternatives of L ESWL was discussed with the patient. I described the risks which include arrhythmia, kidney contusion, kidney hemorrhage, need for transfusion, back discomfort, flank ecchymosis, flank abrasion, inability to break up stone, inability to pass stone fragments, Steinstrasse, infection associated with obstructing stones, need for different surgical procedure and possible need for repeat shockwave lithotripsy. The patient voices understanding and wishes to proceed.

## 2024-06-03 ENCOUNTER — Encounter (HOSPITAL_COMMUNITY): Payer: Self-pay | Admitting: Urology

## 2024-06-03 NOTE — Progress Notes (Signed)
 Spoke w/ via phone for pre-op interview---[name] needs dos----  KUB CBG             COVID test -----0630 NPO after MN NO Solid Food.  Clear liquids from MN until--- Med rec completed. Pt aware to hold ASA/NSAIDs and supplements per [facility] protocol.  Medications to take morning of surgery -----losartan  Diabetic/Weight loss medication ----- No Alcohol or recreational drugs for 24 hours/Tobacco products for 6 hours ---- Patient instructed to bring blue lithotripsy folder, photo id and insurance card day of surgery. Patient aware to have Driver (ride ) / caregiver for 24 hours after surgery -----[name] Patient Special Instructions -----bring blue folder, wear comfortable clothes without metal belt buckles, buttons,or zippers. No flip flops, sandals, crocs, or clogs. Pre-Op special Instructions ----- take laxative of choice day before procedure Patient verbalized understanding of instructions that were given at this phone interview. Patient denies shortness of breath, chest pain, fever, cough at this phone interview.

## 2024-06-04 ENCOUNTER — Other Ambulatory Visit: Payer: Self-pay

## 2024-06-04 ENCOUNTER — Ambulatory Visit (HOSPITAL_COMMUNITY)

## 2024-06-04 ENCOUNTER — Ambulatory Visit (HOSPITAL_COMMUNITY)
Admission: RE | Admit: 2024-06-04 | Discharge: 2024-06-04 | Disposition: A | Discharge status: Home / Self Care | Attending: Urology | Admitting: Urology

## 2024-06-04 ENCOUNTER — Encounter (HOSPITAL_COMMUNITY): Payer: Self-pay | Admitting: Urology

## 2024-06-04 ENCOUNTER — Encounter (HOSPITAL_COMMUNITY)
Admission: RE | Disposition: A | Discharge status: Home / Self Care | Payer: Self-pay | Source: Home / Self Care | Attending: Urology

## 2024-06-04 DIAGNOSIS — Z7984 Long term (current) use of oral hypoglycemic drugs: Secondary | ICD-10-CM | POA: Diagnosis not present

## 2024-06-04 DIAGNOSIS — I10 Essential (primary) hypertension: Secondary | ICD-10-CM | POA: Diagnosis not present

## 2024-06-04 DIAGNOSIS — E119 Type 2 diabetes mellitus without complications: Secondary | ICD-10-CM | POA: Diagnosis not present

## 2024-06-04 DIAGNOSIS — N201 Calculus of ureter: Secondary | ICD-10-CM | POA: Diagnosis not present

## 2024-06-04 DIAGNOSIS — Z960 Presence of urogenital implants: Secondary | ICD-10-CM | POA: Diagnosis not present

## 2024-06-04 HISTORY — PX: EXTRACORPOREAL SHOCK WAVE LITHOTRIPSY: SHX1557

## 2024-06-04 HISTORY — DX: Essential (primary) hypertension: I10

## 2024-06-04 HISTORY — DX: Type 2 diabetes mellitus without complications: E11.9

## 2024-06-04 LAB — GLUCOSE, CAPILLARY: Glucose-Capillary: 117 mg/dL — ABNORMAL HIGH (ref 70–99)

## 2024-06-04 SURGERY — LITHOTRIPSY, ESWL
Anesthesia: LOCAL | Laterality: Left

## 2024-06-04 MED ORDER — CIPROFLOXACIN HCL 500 MG PO TABS
500.0000 mg | ORAL_TABLET | ORAL | Status: AC
  Administered 2024-06-04: 500 mg via ORAL
  Filled 2024-06-04: qty 1

## 2024-06-04 MED ORDER — DIPHENHYDRAMINE HCL 25 MG PO CAPS
25.0000 mg | ORAL_CAPSULE | ORAL | Status: AC
  Administered 2024-06-04: 25 mg via ORAL
  Filled 2024-06-04: qty 1

## 2024-06-04 MED ORDER — TRAMADOL HCL 50 MG PO TABS: 50.0000 mg | ORAL_TABLET | Freq: Four times a day (QID) | ORAL | Status: DC | PRN

## 2024-06-04 MED ORDER — SODIUM CHLORIDE 0.9 % IV SOLN
INTRAVENOUS | Status: DC
  Administered 2024-06-04: 1000 mL via INTRAVENOUS

## 2024-06-04 MED ORDER — DIAZEPAM 5 MG PO TABS
5.0000 mg | ORAL_TABLET | ORAL | Status: AC
  Administered 2024-06-04: 5 mg via ORAL
  Filled 2024-06-04: qty 1

## 2024-06-04 MED ORDER — TRAMADOL HCL 50 MG PO TABS: 50.0000 mg | ORAL_TABLET | Freq: Four times a day (QID) | ORAL | 0 refills | Status: DC | PRN

## 2024-06-04 MED ORDER — DIAZEPAM 5 MG PO TABS: 10.0000 mg | ORAL_TABLET | ORAL | Status: DC

## 2024-06-04 MED ORDER — ONDANSETRON 4 MG PO TBDP: 4.0000 mg | ORAL_TABLET | Freq: Four times a day (QID) | ORAL | 1 refills | Status: DC | PRN

## 2024-06-04 MED ORDER — SODIUM CHLORIDE 0.9% FLUSH: 3.0000 mL | Freq: Two times a day (BID) | INTRAVENOUS | Status: DC

## 2024-06-04 NOTE — Interval H&P Note (Signed)
 History and Physical Interval Note: No change  06/04/2024 8:31 AM  [name]  has presented today for surgery, with the diagnosis of LEFT URETERAL STONE.  The various methods of treatment have been discussed with the patient and family. After consideration of risks, benefits and other options for treatment, the patient has consented to  Procedure(s) with comments: LITHOTRIPSY, ESWL (Left) - LEFT EXTRACORPOREAL SHOCKWAVE LITHOTRIPSY as a surgical intervention.  The patient's history has been reviewed, patient examined, no change in status, stable for surgery.  I have reviewed the patient's chart and labs.  Questions were answered to the patient's satisfaction.     Yuleni Burich

## 2024-06-04 NOTE — Op Note (Signed)
 See scanned [facility] note

## 2024-06-07 ENCOUNTER — Encounter (HOSPITAL_COMMUNITY): Payer: Self-pay | Admitting: Urology

## 2024-06-11 DIAGNOSIS — N201 Calculus of ureter: Secondary | ICD-10-CM | POA: Diagnosis not present

## 2024-06-26 ENCOUNTER — Other Ambulatory Visit: Payer: Self-pay | Admitting: Family Medicine

## 2024-06-26 DIAGNOSIS — E1169 Type 2 diabetes mellitus with other specified complication: Secondary | ICD-10-CM

## 2024-07-02 ENCOUNTER — Encounter: Payer: Self-pay | Admitting: Family Medicine

## 2024-07-02 ENCOUNTER — Ambulatory Visit (INDEPENDENT_AMBULATORY_CARE_PROVIDER_SITE_OTHER): Admitting: Family Medicine

## 2024-07-02 VITALS — BP 124/72 | HR 67 | Ht 62.0 in | Wt 175.6 lb

## 2024-07-02 DIAGNOSIS — E785 Hyperlipidemia, unspecified: Secondary | ICD-10-CM | POA: Diagnosis not present

## 2024-07-02 DIAGNOSIS — Z0000 Encounter for general adult medical examination without abnormal findings: Secondary | ICD-10-CM

## 2024-07-02 DIAGNOSIS — Z23 Encounter for immunization: Secondary | ICD-10-CM

## 2024-07-02 DIAGNOSIS — I10 Essential (primary) hypertension: Secondary | ICD-10-CM

## 2024-07-02 DIAGNOSIS — I509 Heart failure, unspecified: Secondary | ICD-10-CM

## 2024-07-02 DIAGNOSIS — E1169 Type 2 diabetes mellitus with other specified complication: Secondary | ICD-10-CM | POA: Diagnosis not present

## 2024-07-02 LAB — LIPID PANEL

## 2024-07-02 NOTE — Progress Notes (Incomplete)
 Annual Wellness Visit     Patient: [name], Female    DOB: [birth year], 73 y.o.   MRN: [id number]  Subjective  Chief Complaint  Patient presents with  . Annual Exam    Cpe. Fasting.     [name] is a 73 y.o. female who presents today for her Annual Wellness Visit. She reports consuming a {diet types:17450} diet. {Exercise:19826} She generally feels {well/fairly well/poorly:18703}. She reports sleeping {well/fairly well/poorly:18703}. She {does/does not:200015} have additional problems to discuss today.   HPI  {VISON DENTAL STD PSA (Optional):27386}   {History (Optional):23778}  Medications: Outpatient Medications Prior to Visit  Medication Sig  . ACCU-CHEK GUIDE test strip USE TO TEST BLOOD GLUCOSE EVERY MORNING AND AT BEDTIME  . Accu-Chek Softclix Lancets lancets USE TO TEST BLOOD GLUCOSE TWICE DAILY AS DIRECTED.  SABRA loperamide (IMODIUM) 2 MG capsule Take by mouth as needed for diarrhea or loose stools.  . losartan  (COZAAR ) 50 MG tablet TAKE 1 TABLET(50 MG) BY MOUTH DAILY  . metFORMIN  (GLUCOPHAGE ) 500 MG tablet Take 1 tablet (500 mg total) by mouth 2 (two) times daily with a meal.  . Oral Electrolytes (LIQUID I.V. PO) Take by mouth. (Patient taking differently: Take by mouth as needed.)  . Pedialyte (PEDIALYTE) SOLN Take by mouth.  . rosuvastatin  (CRESTOR ) 20 MG tablet TAKE 1 TABLET(20 MG) BY MOUTH DAILY  . VITAMIN D  PO Take 5,000 Units by mouth daily. (Patient not taking: Reported on 07/02/2024)  . [DISCONTINUED] Calcium  Carbonate Antacid (CALCIUM  CARBONATE PO) Take 1,200 mg by mouth 2 (two) times daily with a meal. (Patient not taking: Reported on 07/02/2024)  . [DISCONTINUED] cephALEXin (KEFLEX) 500 MG capsule Take 500 mg by mouth 2 (two) times daily.  . [DISCONTINUED] ondansetron  (ZOFRAN -ODT) 4 MG disintegrating tablet Take 1 tablet (4 mg total) by mouth every 6 (six) hours as needed for nausea or vomiting.  . [DISCONTINUED] traMADol  (ULTRAM ) 50 MG  tablet Take 1 tablet (50 mg total) by mouth every 6 (six) hours as needed for moderate pain (pain score 4-6).   No facility-administered medications prior to visit.    Allergies  Allergen Reactions  . Oxycodone-Acetaminophen Other (See Comments)    Nausea/vomiting    Patient Care Team: Joyce Norleen BROCKS, MD as PCP - General (Family Medicine)  ROS      Objective  BP 124/72   Pulse 67   Ht 5' 2 (1.575 m)   Wt 175 lb 9.6 oz (79.7 kg)   LMP 02/20/1999 (Exact Date)   SpO2 98%   BMI 32.12 kg/m  {Vitals History (Optional):23777}  Physical Exam    Most recent functional status assessment:    07/02/2024    8:11 AM  In your present state of health, do you have any difficulty performing the following activities:  Hearing? 0  Vision? 0  Difficulty concentrating or making decisions? 0  Walking or climbing stairs? 0  Dressing or bathing? 0  Doing errands, shopping? 0  Preparing Food and eating ? N  Using the Toilet? N  In the past six months, have you accidently leaked urine? Y  Do you have problems with loss of bowel control? N  Managing your Medications? N  Managing your Finances? N  Housekeeping or managing your Housekeeping? N   Most recent fall risk assessment:    07/02/2024    8:14 AM  Fall Risk   Falls in the past year? 0  Number falls in past yr: 0  Injury with Fall? 0  Risk for fall due to : No Fall Risks  Follow up Falls evaluation completed    Most recent depression screenings:    07/02/2024    8:14 AM 05/21/2024    4:34 PM  PHQ 2/9 Scores  PHQ - 2 Score 0 0   Most recent cognitive screening:    07/02/2024    8:14 AM  6CIT Screen  What Year? 0 points  What month? 0 points  What time? 0 points  Count back from 20 0 points  Months in reverse 0 points  Repeat phrase 0 points  Total Score 0 points   Most recent Audit-C alcohol use screening    06/28/2024    8:51 AM  Alcohol Use Disorder Test (AUDIT)  1. How often do you have a drink containing  alcohol? 0  3. How often do you have six or more drinks on one occasion? 0   A score of 3 or more in women, and 4 or more in men indicates increased risk for alcohol abuse, EXCEPT if all of the points are from question 1   Vision/Hearing Screen: No results found.  {Labs (Optional):23779}  No results found for any visits on 07/02/24.    Assessment & Plan   Annual wellness visit done today including the all of the following: Reviewed patient's Family Medical History Reviewed and updated list of patient's medical providers Assessment of cognitive impairment was done Assessed patient's functional ability Established a written schedule for health screening services Health Risk Assessent Completed and Reviewed  Exercise Activities and Dietary recommendations  Goals     . Increase physical activity        Immunization History  Administered Date(s) Administered  . Fluad Trivalent(High Dose 65+) 08/08/2023  . INFLUENZA, HIGH DOSE SEASONAL PF 08/16/2018, 08/20/2019, 07/02/2024  . Influenza Split 07/27/2012, 07/29/2013, 08/10/2015  . Influenza Whole 07/16/2010, 08/05/2011  . Influenza,inj,Quad PF,6+ Mos 08/12/2017  . Influenza-Unspecified 08/25/2016, 08/20/2019, 06/24/2020, 07/27/2021, 07/12/2022  . Moderna Sars-Covid-2 Vaccination 12/03/2019, 12/31/2019, 09/01/2020  . PFIZER Comirnaty(Gray Top)Covid-19 Tri-Sucrose Vaccine 06/01/2021, 07/11/2023  . Pfizer Covid-19 Vaccine Bivalent Booster 100yrs & up 11/02/2021  . Pneumococcal Conjugate-13 09/01/2018  . Pneumococcal Polysaccharide-23 09/23/2019  . Rabies, IM 07/15/2012, 07/18/2012  . Tdap 10/21/2008, 09/01/2018  . Unspecified SARS-COV-2 Vaccination 08/02/2022  . Zoster Recombinant(Shingrix ) 02/07/2017, 08/12/2017  . Zoster, Live 09/08/2013    Health Maintenance  Topic Date Due  . Fecal DNA (Cologuard)  10/16/2023  . Diabetic kidney evaluation - Urine ACR  11/29/2023  . COVID-19 Vaccine (8 - 2025-26 season) 06/21/2024  .  Diabetic kidney evaluation - eGFR measurement  09/20/2024  . Medicare Annual Wellness (AWV)  07/02/2025  . DTaP/Tdap/Td (3 - Td or Tdap) 09/01/2028  . Pneumococcal Vaccine: 50+ Years  Completed  . Influenza Vaccine  Completed  . DEXA SCAN  Completed  . Hepatitis C Screening  Completed  . Zoster Vaccines- Shingrix   Completed  . HPV VACCINES  Aged Out  . Meningococcal B Vaccine  Aged Out  . Mammogram  Discontinued     Discussed health benefits of physical activity, and encouraged her to engage in regular exercise appropriate for her age and condition.    Problem List Items Addressed This Visit       Cardiovascular and Mediastinum   Essential hypertension     Other   Hyperlipidemia with target LDL less than 100   Relevant Orders   Lipid Panel   Other Visit Diagnoses       Medicare annual wellness  visit, subsequent    -  Primary   Relevant Orders   Cologuard     Flu vaccine need       Relevant Orders   Flu vaccine HIGH DOSE PF(Fluzone Trivalent) (Completed)     Type 2 diabetes mellitus with other specified complication, without long-term current use of insulin (HCC)       Relevant Orders   CBC with Differential     Chronic congestive heart failure, unspecified heart failure type (HCC)       Relevant Orders   ECHOCARDIOGRAM COMPLETE     Annual physical exam           No follow-ups on file.     Haruo Stepanek A Javaughn Opdahl, MD

## 2024-07-02 NOTE — Progress Notes (Addendum)
 Name: [name]   Date of Visit: 07/02/24   Date of last visit with me: Visit date not found   CHIEF COMPLAINT:  Chief Complaint  Patient presents with   Annual Exam    Cpe. Fasting.        HPI:  Discussed the use of AI scribe software for clinical note transcription with the patient, who gave verbal consent to proceed.  History of Present Illness   [name] is a 73 year old female with type 2 diabetes who presents for a routine follow-up visit.  She has been on metformin  since last year. Her last A1c was 6.3. She has been focusing on weight management, noting recent fluctuations with both weight gain and loss, and a decrease in appetite. No fatigue or tiredness is reported.  She recently experienced a kidney stone, requiring emergency treatment while at a convention in [place]. She was hospitalized, had a stent placed, and underwent lithotripsy. She is scheduled for a recheck on the 26th of this month. During her hospitalization, her troponin levels were elevated, and she experienced high fever, shivering, and difficulty breathing.  She has not had a recent Cologuard test, with the last one being in 2021, and prefers this method for colon cancer screening.  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         OBJECTIVE:       07/02/2024    8:14 AM  Depression screen PHQ 2/9  Decreased Interest 0  Down, Depressed, Hopeless 0  PHQ - 2 Score 0     BP Readings from Last 3 Encounters:  07/02/24 124/72  06/04/24 (!) 142/76  05/26/24 132/70    BP 124/72   Pulse 67   Ht 5' 2 (1.575 m)   Wt 175 lb 9.6 oz (79.7 kg)   LMP 02/20/1999 (Exact Date)   SpO2 98%   BMI 32.12 kg/m    Physical Exam          Physical Exam Constitutional:      Appearance: Normal appearance.  Neurological:     General: No focal deficit present.     Mental Status: She is alert and oriented to person, place, and time. Mental status is at baseline.      ASSESSMENT/PLAN:   Assessment & Plan Flu vaccine need  Medicare annual wellness visit, subsequent  Hyperlipidemia with target LDL less than 100  Essential hypertension  Type 2 diabetes mellitus with other specified complication, without long-term current use of insulin (HCC)  Chronic congestive heart failure, unspecified heart failure type [facility])  Annual physical exam    Assessment and Plan    Adult Wellness Visit Routine wellness visit with no acute concerns. A1c is 6.3%. - Order Cologuard for colorectal cancer screening. - Administer flu vaccine. - Discuss COVID-19 vaccination. - Schedule follow-up in November for A1c recheck.  Type 2 diabetes mellitus Type 2 diabetes mellitus managed with metformin . A1c is 6.3%, indicating good control. Prefers lifestyle modifications over pharmacological interventions for weight loss. - Continue metformin . - Encourage weight loss through increased physical activity and dietary modifications. - Recheck A1c in November.  Obesity Obesity contributing to diabetes. Recent weight loss noted. Prefers lifestyle modifications over pharmacological interventions for weight loss. - Encourage increased physical activity and dietary modifications. - Discuss potential pharmacological options for weight loss if needed.  Nephrolithiasis Recent episode requiring emergency intervention and stent placement. Elevated troponin levels likely due to pain and dehydration. Echocardiogram ordered to assess  cardiac function. - Order echocardiogram to assess cardiac function.    I reviewed and addressed the Medicare-specific components of today's visit, including preventive services eligibility, screening recommendations, and health maintenance items as applicable.      Laine Giovanetti A. Vita MD [facility]

## 2024-07-03 LAB — CBC WITH DIFFERENTIAL/PLATELET
Basophils Absolute: 0 x10E3/uL (ref 0.0–0.2)
Basos: 1 %
EOS (ABSOLUTE): 0.2 x10E3/uL (ref 0.0–0.4)
Eos: 4 %
Hematocrit: 39.1 % (ref 34.0–46.6)
Hemoglobin: 12.3 g/dL (ref 11.1–15.9)
Immature Grans (Abs): 0 x10E3/uL (ref 0.0–0.1)
Immature Granulocytes: 0 %
Lymphocytes Absolute: 2 x10E3/uL (ref 0.7–3.1)
Lymphs: 33 %
MCH: 28.3 pg (ref 26.6–33.0)
MCHC: 31.5 g/dL (ref 31.5–35.7)
MCV: 90 fL (ref 79–97)
Monocytes Absolute: 0.5 x10E3/uL (ref 0.1–0.9)
Monocytes: 9 %
Neutrophils Absolute: 3.3 x10E3/uL (ref 1.4–7.0)
Neutrophils: 53 %
Platelets: 280 x10E3/uL (ref 150–450)
RBC: 4.34 x10E6/uL (ref 3.77–5.28)
RDW: 14.3 % (ref 11.7–15.4)
WBC: 6.1 x10E3/uL (ref 3.4–10.8)

## 2024-07-03 LAB — LIPID PANEL
Cholesterol, Total: 153 mg/dL (ref 100–199)
HDL: 37 mg/dL — AB (ref >39)
LDL CALC COMMENT:: 4.1 ratio (ref 0.0–4.4)
LDL Chol Calc (NIH): 71 mg/dL (ref 0–99)
Triglycerides: 282 mg/dL — AB (ref 0–149)
VLDL Cholesterol Cal: 45 mg/dL — AB (ref 5–40)

## 2024-07-05 ENCOUNTER — Ambulatory Visit: Payer: Self-pay | Admitting: Family Medicine

## 2024-07-14 ENCOUNTER — Other Ambulatory Visit: Payer: Self-pay | Admitting: Family Medicine

## 2024-07-14 DIAGNOSIS — E119 Type 2 diabetes mellitus without complications: Secondary | ICD-10-CM

## 2024-07-16 ENCOUNTER — Other Ambulatory Visit (HOSPITAL_COMMUNITY): Payer: Self-pay

## 2024-07-16 DIAGNOSIS — N201 Calculus of ureter: Secondary | ICD-10-CM | POA: Diagnosis not present

## 2024-07-19 ENCOUNTER — Telehealth: Payer: Self-pay

## 2024-07-19 ENCOUNTER — Other Ambulatory Visit: Payer: Self-pay

## 2024-07-19 DIAGNOSIS — Z1211 Encounter for screening for malignant neoplasm of colon: Secondary | ICD-10-CM

## 2024-07-19 NOTE — Telephone Encounter (Signed)
 A new order has been put in with the requested diagnosis code.    Copied from CRM #[id number]. Topic: Clinical - Request for Lab/Test Order >> Jul 19, 2024  3:34 PM Darshell M wrote: Reason for CRM: [facility] [facility] calling regarding Cologuard order. ICD-10 code Z00.00 is incorrect and will not be covered by insurance. Needs to be updated to z12.11 or z12.12. They can take verbal order as well. CB # [phone], provider support.

## 2024-07-20 ENCOUNTER — Other Ambulatory Visit (HOSPITAL_COMMUNITY): Payer: Self-pay

## 2024-07-21 ENCOUNTER — Other Ambulatory Visit (HOSPITAL_COMMUNITY): Payer: Self-pay

## 2024-07-21 ENCOUNTER — Telehealth: Payer: Self-pay

## 2024-07-21 MED FILL — Metformin HCl Tab 500 MG: ORAL | 90 days supply | Qty: 180 | Fill #0 | Status: CN

## 2024-07-21 MED FILL — Losartan Potassium Tab 50 MG: ORAL | 90 days supply | Qty: 90 | Fill #0 | Status: CN

## 2024-07-21 NOTE — Telephone Encounter (Signed)
 Copied from CRM [id number]. Topic: General - Other >> Jul 21, 2024 11:11 AM Debby BROCKS wrote: Reason for CRM: Renee from [employer] is calling for clarification on the cologuard Order that was received as on their end they see an ICD code of Z00.00 Callback: [phone]   [facility] [employer] to give verbal code for cologuard order.

## 2024-07-22 ENCOUNTER — Other Ambulatory Visit (HOSPITAL_COMMUNITY): Payer: Self-pay

## 2024-07-22 ENCOUNTER — Other Ambulatory Visit: Payer: Self-pay

## 2024-07-22 ENCOUNTER — Encounter: Payer: Self-pay | Admitting: Pharmacist

## 2024-07-22 MED ORDER — ONDANSETRON 4 MG PO TBDP
4.0000 mg | ORAL_TABLET | Freq: Four times a day (QID) | ORAL | 1 refills | Status: DC | PRN
  Filled 2024-07-22: qty 12, 3d supply, fill #0

## 2024-07-22 MED FILL — Glucose Blood Test Strip: 100 days supply | Qty: 200 | Fill #0 | Status: CN

## 2024-07-22 MED FILL — Lancets: 50 days supply | Qty: 100 | Fill #0 | Status: CN

## 2024-08-04 ENCOUNTER — Other Ambulatory Visit: Payer: Self-pay | Admitting: Family Medicine

## 2024-08-04 ENCOUNTER — Other Ambulatory Visit (HOSPITAL_COMMUNITY): Payer: Self-pay

## 2024-08-04 DIAGNOSIS — E785 Hyperlipidemia, unspecified: Secondary | ICD-10-CM

## 2024-08-04 NOTE — Telephone Encounter (Unsigned)
 Copied from CRM #[id number]. Topic: Clinical - Medication Refill >> Aug 04, 2024  4:39 PM Lauren C wrote: Medication: rosuvastatin  (CRESTOR ) 20 MG tablet  Has the patient contacted their pharmacy? Yes No more refills.  This is the patient's preferred pharmacy:  [facility] [address] [place] [address] Phone: [phone] Fax: [phone]  Is this the correct pharmacy for this prescription? Yes If no, delete pharmacy and type the correct one.   Has the prescription been filled recently? Yes  Is the patient out of the medication? No  Has the patient been seen for an appointment in the last year OR does the patient have an upcoming appointment? Yes  Can we respond through MyChart? No, #[phone]  Agent: Please be advised that Rx refills may take up to 3 business days. We ask that you follow-up with your pharmacy.

## 2024-08-05 ENCOUNTER — Ambulatory Visit (HOSPITAL_COMMUNITY)
Admission: RE | Admit: 2024-08-05 | Discharge: 2024-08-05 | Disposition: A | Discharge status: Home / Self Care | Source: Ambulatory Visit | Attending: Family Medicine | Admitting: Family Medicine

## 2024-08-05 ENCOUNTER — Other Ambulatory Visit: Payer: Self-pay

## 2024-08-05 ENCOUNTER — Other Ambulatory Visit (HOSPITAL_COMMUNITY): Payer: Self-pay

## 2024-08-05 DIAGNOSIS — I509 Heart failure, unspecified: Secondary | ICD-10-CM | POA: Insufficient documentation

## 2024-08-05 DIAGNOSIS — I080 Rheumatic disorders of both mitral and aortic valves: Secondary | ICD-10-CM | POA: Insufficient documentation

## 2024-08-05 LAB — ECHOCARDIOGRAM COMPLETE
AR max vel: 2.41 cm2
AV Area VTI: 2.5 cm2
AV Area mean vel: 2.67 cm2
AV Mean grad: 7 mmHg
AV Peak grad: 13.4 mmHg
Ao pk vel: 1.83 m/s
Area-P 1/2: 4.06 cm2
Calc EF: 71.7 %
S' Lateral: 2.6 cm
Single Plane A2C EF: 63.9 %
Single Plane A4C EF: 78.2 %

## 2024-08-05 MED ORDER — ROSUVASTATIN CALCIUM 20 MG PO TABS
20.0000 mg | ORAL_TABLET | Freq: Every day | ORAL | 0 refills | Status: DC
  Filled 2024-08-05: qty 90, 90d supply, fill #0

## 2024-08-05 NOTE — Progress Notes (Signed)
  Echocardiogram 2D Echocardiogram has been performed.  Norleen ORN Denim Start 08/05/2024, 8:30 AM

## 2024-08-08 LAB — COLOGUARD: COLOGUARD: NEGATIVE

## 2024-09-03 ENCOUNTER — Other Ambulatory Visit (HOSPITAL_COMMUNITY): Payer: Self-pay

## 2024-09-03 ENCOUNTER — Other Ambulatory Visit

## 2024-09-03 ENCOUNTER — Other Ambulatory Visit (HOSPITAL_BASED_OUTPATIENT_CLINIC_OR_DEPARTMENT_OTHER): Payer: Self-pay

## 2024-09-03 ENCOUNTER — Ambulatory Visit: Payer: Self-pay | Admitting: Family Medicine

## 2024-09-03 ENCOUNTER — Encounter: Payer: Self-pay | Admitting: Family Medicine

## 2024-09-03 VITALS — BP 124/84 | HR 67 | Wt 172.6 lb

## 2024-09-03 DIAGNOSIS — Z23 Encounter for immunization: Secondary | ICD-10-CM | POA: Diagnosis not present

## 2024-09-03 DIAGNOSIS — Z7984 Long term (current) use of oral hypoglycemic drugs: Secondary | ICD-10-CM

## 2024-09-03 DIAGNOSIS — E1169 Type 2 diabetes mellitus with other specified complication: Secondary | ICD-10-CM

## 2024-09-03 DIAGNOSIS — E119 Type 2 diabetes mellitus without complications: Secondary | ICD-10-CM

## 2024-09-03 LAB — POCT GLYCOSYLATED HEMOGLOBIN (HGB A1C): Hemoglobin A1C: 6.1 % — AB (ref 4.0–5.6)

## 2024-09-03 MED ORDER — METFORMIN HCL 500 MG PO TABS
500.0000 mg | ORAL_TABLET | Freq: Two times a day (BID) | ORAL | 0 refills | Status: AC
  Filled 2024-09-03 – 2024-09-24 (×2): qty 180, 90d supply, fill #0

## 2024-09-03 NOTE — Progress Notes (Signed)
   Name: [name]   Date of Visit: 09/03/24   Date of last visit with me: 07/02/2024   CHIEF COMPLAINT:  Chief Complaint  Patient presents with   Medication Management    Med check. A1c check. No other concerns.        HPI:  Discussed the use of AI scribe software for clinical note transcription with the patient, who gave verbal consent to proceed.  History of Present Illness   [name] is a 73 year old female who presents for routine follow-up.  Her A1c has decreased to 6.1, which she attributes to careful dietary management. She is currently on a lower dose of metformin . She reports no issues related to her diabetes.  She mentions that her echocardiogram results were satisfactory.  She plans to visit a breakfast place with her family, noting that her son, who is one year and three months old, enjoys eating there. Her wife is thirty weeks pregnant with her second child, a boy.  No new or concerning symptoms were reported during the review of symptoms.         OBJECTIVE:       07/02/2024    8:14 AM  Depression screen PHQ 2/9  Decreased Interest 0  Down, Depressed, Hopeless 0  PHQ - 2 Score 0     BP Readings from Last 3 Encounters:  09/03/24 124/84  07/02/24 124/72  06/04/24 (!) 142/76    BP 124/84   Pulse 67   Wt 172 lb 9.6 oz (78.3 kg)   LMP 02/20/1999 (Exact Date)   SpO2 97%   BMI 31.57 kg/m    Physical Exam          Physical Exam  ASSESSMENT/PLAN:   Assessment & Plan Need for COVID-19 vaccine  Type 2 diabetes mellitus with other specified complication, without long-term current use of insulin (HCC)  Diabetes mellitus treated with oral medication (HCC)    Assessment and Plan    Type 2 diabetes mellitus Well-controlled with A1c of 6.1. Down from 6.3 Effective management with low complication risk. - Continue metformin  at current dose. Of 500mg  BID - Recheck A1c in six months.    - Metformin  sent to pharmacy       [name] A. Vita MD [facility]

## 2024-09-22 NOTE — Progress Notes (Signed)
 [name]                                          MRN: [id number]   09/22/2024   The [facility] Quality Team Specialist reviewed this patient medical record for the purposes of chart review for care gap closure. The following were reviewed: abstraction for care gap closure-glycemic status assessment.    [facility] Quality Team

## 2024-09-24 ENCOUNTER — Other Ambulatory Visit: Payer: Self-pay

## 2024-09-27 ENCOUNTER — Other Ambulatory Visit (HOSPITAL_COMMUNITY): Payer: Self-pay

## 2024-09-27 ENCOUNTER — Other Ambulatory Visit: Payer: Self-pay

## 2024-09-27 MED FILL — Losartan Potassium Tab 50 MG: ORAL | 90 days supply | Qty: 90 | Fill #0 | Status: AC

## 2024-10-22 ENCOUNTER — Other Ambulatory Visit (HOSPITAL_COMMUNITY): Payer: Self-pay

## 2024-10-22 ENCOUNTER — Other Ambulatory Visit: Payer: Self-pay

## 2024-10-22 MED FILL — Lancets: 50 days supply | Qty: 100 | Fill #0 | Status: AC

## 2024-10-25 ENCOUNTER — Other Ambulatory Visit (HOSPITAL_COMMUNITY): Payer: Self-pay

## 2024-10-27 ENCOUNTER — Other Ambulatory Visit: Payer: Self-pay

## 2024-10-27 ENCOUNTER — Other Ambulatory Visit (HOSPITAL_COMMUNITY): Payer: Self-pay

## 2024-10-27 ENCOUNTER — Other Ambulatory Visit: Payer: Self-pay | Admitting: Family Medicine

## 2024-10-27 DIAGNOSIS — E785 Hyperlipidemia, unspecified: Secondary | ICD-10-CM

## 2024-10-27 MED ORDER — ROSUVASTATIN CALCIUM 20 MG PO TABS
20.0000 mg | ORAL_TABLET | Freq: Every day | ORAL | 1 refills | Status: AC
  Filled 2024-10-27: qty 90, 90d supply, fill #0

## 2025-03-04 ENCOUNTER — Ambulatory Visit: Admitting: Family Medicine
